# Patient Record
Sex: Female | Born: 1980 | Race: Black or African American | Hispanic: No | Marital: Single | Smoking: Never smoker
Health system: Southern US, Community
[De-identification: ages and names within clinical notes are randomized; demographics above are authoritative.]

## PROBLEM LIST (undated history)

## (undated) ENCOUNTER — Emergency Department (HOSPITAL_COMMUNITY): Disposition: A | Discharge status: Home / Self Care | Payer: Self-pay

## (undated) DIAGNOSIS — I10 Essential (primary) hypertension: Secondary | ICD-10-CM

## (undated) DIAGNOSIS — G43009 Migraine without aura, not intractable, without status migrainosus: Secondary | ICD-10-CM

## (undated) HISTORY — PX: MANDIBLE FRACTURE SURGERY: SHX706

---

## 1999-12-05 ENCOUNTER — Other Ambulatory Visit: Admission: RE | Admit: 1999-12-05 | Discharge: 1999-12-05 | Payer: Self-pay | Admitting: Obstetrics

## 2001-11-07 ENCOUNTER — Emergency Department (HOSPITAL_COMMUNITY): Admission: EM | Admit: 2001-11-07 | Discharge: 2001-11-07 | Payer: Self-pay | Admitting: Emergency Medicine

## 2002-01-11 ENCOUNTER — Emergency Department (HOSPITAL_COMMUNITY): Admission: EM | Admit: 2002-01-11 | Discharge: 2002-01-11 | Payer: Self-pay | Admitting: Emergency Medicine

## 2002-06-25 ENCOUNTER — Observation Stay (HOSPITAL_COMMUNITY): Admission: AD | Admit: 2002-06-25 | Discharge: 2002-06-26 | Payer: Self-pay | Admitting: Obstetrics

## 2002-06-26 ENCOUNTER — Encounter: Payer: Self-pay | Admitting: Obstetrics

## 2002-07-07 ENCOUNTER — Observation Stay (HOSPITAL_COMMUNITY): Admission: AD | Admit: 2002-07-07 | Discharge: 2002-07-08 | Payer: Self-pay | Admitting: Obstetrics

## 2002-08-04 ENCOUNTER — Inpatient Hospital Stay (HOSPITAL_COMMUNITY): Admission: AD | Admit: 2002-08-04 | Discharge: 2002-08-07 | Payer: Self-pay | Admitting: Obstetrics

## 2002-08-04 ENCOUNTER — Encounter (INDEPENDENT_AMBULATORY_CARE_PROVIDER_SITE_OTHER): Payer: Self-pay

## 2002-08-04 ENCOUNTER — Encounter: Payer: Self-pay | Admitting: Obstetrics

## 2003-01-21 ENCOUNTER — Emergency Department (HOSPITAL_COMMUNITY): Admission: EM | Admit: 2003-01-21 | Discharge: 2003-01-21 | Payer: Self-pay | Admitting: Emergency Medicine

## 2003-06-09 ENCOUNTER — Emergency Department (HOSPITAL_COMMUNITY): Admission: AD | Admit: 2003-06-09 | Discharge: 2003-06-09 | Payer: Self-pay | Admitting: Emergency Medicine

## 2003-06-09 ENCOUNTER — Encounter: Payer: Self-pay | Admitting: Emergency Medicine

## 2004-05-06 ENCOUNTER — Emergency Department (HOSPITAL_COMMUNITY): Admission: EM | Admit: 2004-05-06 | Discharge: 2004-05-06 | Payer: Self-pay | Admitting: Family Medicine

## 2005-01-17 ENCOUNTER — Emergency Department (HOSPITAL_COMMUNITY): Admission: EM | Admit: 2005-01-17 | Discharge: 2005-01-17 | Payer: Self-pay | Admitting: *Deleted

## 2007-04-21 ENCOUNTER — Emergency Department (HOSPITAL_COMMUNITY): Admission: EM | Admit: 2007-04-21 | Discharge: 2007-04-21 | Payer: Self-pay | Admitting: *Deleted

## 2008-02-27 ENCOUNTER — Emergency Department (HOSPITAL_COMMUNITY): Admission: EM | Admit: 2008-02-27 | Discharge: 2008-02-27 | Payer: Self-pay | Admitting: Emergency Medicine

## 2008-07-20 ENCOUNTER — Emergency Department (HOSPITAL_COMMUNITY): Admission: EM | Admit: 2008-07-20 | Discharge: 2008-07-20 | Payer: Self-pay | Admitting: Emergency Medicine

## 2011-03-30 NOTE — Discharge Summary (Signed)
   [name], Lisa                           ACCOUNT NO.:  [id number]   MEDICAL RECORD NO.:  [id number]                   PATIENT TYPE:  INP   LOCATION:  9108                                 FACILITY:  [facility]   PHYSICIAN:  Kathreen Cosier, M.D.           DATE OF BIRTH:  [birth year]   DATE OF ADMISSION:  08/04/2002  DATE OF DISCHARGE:                                 DISCHARGE SUMMARY   HISTORY OF PRESENT ILLNESS:  The patient is a 30 year old gravida 5, para 3-  0-1-3, [facility] August 29, 2002 pregnant with twins.  There was a question of  leaking fluid on September 23 for two days and the patient was in early  labor.  On examination cervix was 2 cm dilated with a breech presentation.  Ultrasound showed twin A breech 853 estimated fetal weight 2098-2399 g.  Twin B vertex, AFI 3.9, estimated fetal weight 2611 g.  The patient  underwent primary low transverse cesarean section and tubal ligation.  Twin  A with Apgar 8/9 weighing 5 pounds 2 ounces.  Twin B Apgar 3, 7, 9 weighing  4 pounds 4 ounces.  Placenta was sent to pathology.  Tubal ligation  performed.  Postoperatively she did well.  On admission her hemoglobin was  9.8, platelets 132,000.  Post delivery hemoglobin 8.4, platelets 128,000.  The patient did well and was discharged home on the third postoperative day,  ambulatory, on a regular diet, on Tylox one every three to four hours  p.r.n., to see me in six weeks.   DISCHARGE DIAGNOSES:  Status post primary low transverse cesarean section 36  weeks of a twin gestation presenting twin A breech, multiparity, tubal  ligation.                                               Kathreen Cosier, M.D.    BAM/MEDQ  D:  08/07/2002  T:  08/07/2002  Job:  [id number]

## 2011-03-30 NOTE — Op Note (Signed)
   [name], Lisa [name]                           ACCOUNT NO.:  [id number]   MEDICAL RECORD NO.:  [id number]                   PATIENT TYPE:  INP   LOCATION:  9108                                 FACILITY:  [facility]   PHYSICIAN:  Kathreen Cosier, M.D.           DATE OF BIRTH:  Aug 25, 1981   DATE OF PROCEDURE:  08/04/2002  DATE OF DISCHARGE:                                 OPERATIVE REPORT   PREOPERATIVE DIAGNOSES:  1. Intrauterine pregnancy, twins, 36 weeks.  2. Labor.  3. Breech presentation, Twin A.  4. Possible ruptured membranes.   ANESTHESIA:  Spinal.   SURGEON:  Kathreen Cosier, M.D.   PROCEDURE:  The patient placed on the operating table in supine position.  Abdomen prepped and draped.  Bladder emptied with a Foley catheter.  Transverse suprapubic incision made, carried down through the rectus fascia.  The fascia cleaned and incised the length of the incision.  The rectus  muscles retracted laterally, peritoneum incised longitudinally.  A  transverse incision made in the visceral peritoneum above the bladder and  the bladder mobilized inferiorly.  Transverse lower uterine incision made.  Twin _______ was normal vertex, female, Apgar 8 and 9, weighing 5 pounds 2  ounces.  The membranes were intact.  Twin ________, breech presentation,  female, Apgars 3, 7, 9, 4 pounds 4 ounces.  The fluid was clear for both and  there were two placentas, two sacs.  The placenta was sent to pathology.  The uterine cavity cleaned and dry with laps.  The uterine incision closed  in one layer with continuous suture of 1 chromic.  Bladder flap reattached  with 2-0 chromic.  The right tube was grasped in the midportion with a  Babcock clamp and 0 plain suture placed in the mesosalpinx below the portion  of the tube in the clamp.  This was tied and approximately one inch of tube  transected.  On the _______ side, the tube was intimately adherent to some  large distended vessels and two portions of  tube removed from the ______  side and sent to pathology.  The abdomen closed in layers.  Peritoneum  continuous with 0 chromic, fascia continuous with 0 Dexon.  Next, the skin  closed with subcuticular stitch of 3-0 Monocryl.                                               Kathreen Cosier, M.D.    BAM/MEDQ  D:  08/04/2002  T:  08/04/2002  Job:  [id number]

## 2011-03-30 NOTE — H&P (Signed)
   NAMECODI, FOLKERTS                           ACCOUNT NO.:  [id number]   MEDICAL RECORD NO.:  [id number]                   PATIENT TYPE:  INP   LOCATION:  9108                                 FACILITY:  [facility]   PHYSICIAN:  Kathreen Cosier, M.D.           DATE OF BIRTH:  1981/02/13   DATE OF ADMISSION:  08/04/2002  DATE OF DISCHARGE:                                HISTORY & PHYSICAL   HISTORY OF PRESENT ILLNESS:  The patient is a 30 year old gravida 5, para 3-  0-1-3, [facility] August 29, 2002, pregnant with twins.  The patient said she was  leaking fluid for approximately two days.  Perineum was noted to be wet, but  no fluid noted on speculum examination.  She was sent to the hospital for an  ultrasound.  Twin A was a breech 853.  Estimated fetal weight was __________  g.  Twin B vertex 853.9.  Estimated fetal weight 2611 g.  Cervix is 2 cm.  There is a breech presenting at -3 station.  The patient was in labor and it  is decided she will be delivered by a cesarean section at 36 weeks because  of possible leaking fluid and twin A breech presentation.   PHYSICAL EXAMINATION:  GENERAL:  Well-developed female in early labor.  HEENT:  Negative.  LUNGS:  Clear.  HEART:  Regular rhythm.  No murmurs.  No gallops.  ABDOMEN:  Term sized uterus.  PELVIC:  As described above.  EXTREMITIES:  Negative.                                               Kathreen Cosier, M.D.    BAM/MEDQ  D:  08/04/2002  T:  08/05/2002  Job:  [id number]

## 2011-08-07 LAB — POCT PREGNANCY, URINE
Operator id: 239701
Preg Test, Ur: NEGATIVE

## 2011-08-07 LAB — POCT URINALYSIS DIP (DEVICE)
Operator id: 239701
Protein, ur: NEGATIVE
Urobilinogen, UA: 0.2
pH: 5.5

## 2011-08-07 LAB — URINE CULTURE: Colony Count: 50000

## 2011-08-15 LAB — COMPREHENSIVE METABOLIC PANEL
Albumin: 3.8
Alkaline Phosphatase: 58
BUN: 8
Calcium: 8.8
Creatinine, Ser: 0.6
Glucose, Bld: 84
Potassium: 4
Total Protein: 7.3

## 2011-08-15 LAB — DIFFERENTIAL
Basophils Relative: 1
Lymphocytes Relative: 26
Lymphs Abs: 1.3
Monocytes Absolute: 0.5
Monocytes Relative: 11
Neutro Abs: 3
Neutrophils Relative %: 61

## 2011-08-15 LAB — URINALYSIS, ROUTINE W REFLEX MICROSCOPIC
Glucose, UA: NEGATIVE
Glucose, UA: NEGATIVE
Hgb urine dipstick: NEGATIVE
Hgb urine dipstick: NEGATIVE
Protein, ur: NEGATIVE
Protein, ur: NEGATIVE
Specific Gravity, Urine: 1.009
Specific Gravity, Urine: 1.02
Urobilinogen, UA: 0.2

## 2011-08-15 LAB — CBC
HCT: 35.7 — ABNORMAL LOW
Hemoglobin: 11.8 — ABNORMAL LOW
MCHC: 33
Platelets: 228
RDW: 13.9

## 2011-08-15 LAB — URINE MICROSCOPIC-ADD ON

## 2011-08-15 LAB — POCT PREGNANCY, URINE: Preg Test, Ur: NEGATIVE

## 2011-08-15 LAB — WET PREP, GENITAL

## 2011-08-15 LAB — GC/CHLAMYDIA PROBE AMP, GENITAL: GC Probe Amp, Genital: NEGATIVE

## 2012-06-02 ENCOUNTER — Emergency Department (HOSPITAL_COMMUNITY)
Admission: EM | Admit: 2012-06-02 | Discharge: 2012-06-02 | Discharge status: Against Medical Advice | Payer: Self-pay | Attending: Emergency Medicine | Admitting: Emergency Medicine

## 2012-06-02 ENCOUNTER — Encounter (HOSPITAL_COMMUNITY): Payer: Self-pay | Admitting: *Deleted

## 2012-06-02 DIAGNOSIS — R300 Dysuria: Secondary | ICD-10-CM | POA: Insufficient documentation

## 2012-06-02 DIAGNOSIS — R109 Unspecified abdominal pain: Secondary | ICD-10-CM | POA: Insufficient documentation

## 2012-06-02 LAB — URINALYSIS, ROUTINE W REFLEX MICROSCOPIC
Hgb urine dipstick: NEGATIVE
Nitrite: NEGATIVE
Specific Gravity, Urine: 1.018 (ref 1.005–1.030)
Urobilinogen, UA: 1 mg/dL (ref 0.0–1.0)
pH: 7.5 (ref 5.0–8.0)

## 2012-06-02 LAB — POCT PREGNANCY, URINE: Preg Test, Ur: NEGATIVE

## 2012-06-02 NOTE — ED Notes (Signed)
Pt reports continuous right flank pain, reports that she has had discomfort with urination.

## 2012-06-02 NOTE — ED Notes (Signed)
x2 check, pt not in the room, will recheck

## 2012-06-02 NOTE — ED Notes (Signed)
Went into the pts room, pt not in the room, will recheck to see if the pt left AMA

## 2012-06-02 NOTE — ED Notes (Signed)
Updated on wait time, verbalized understanding.

## 2012-06-02 NOTE — ED Notes (Signed)
x3 recheck, pt not in room, pt LWBS after triage

## 2012-12-23 ENCOUNTER — Emergency Department (HOSPITAL_COMMUNITY)
Admission: EM | Admit: 2012-12-23 | Discharge: 2012-12-23 | Disposition: A | Discharge status: Home / Self Care | Payer: Self-pay | Attending: Emergency Medicine | Admitting: Emergency Medicine

## 2012-12-23 ENCOUNTER — Emergency Department (HOSPITAL_COMMUNITY): Payer: Self-pay

## 2012-12-23 ENCOUNTER — Encounter (HOSPITAL_COMMUNITY): Payer: Self-pay | Admitting: Emergency Medicine

## 2012-12-23 DIAGNOSIS — F1010 Alcohol abuse, uncomplicated: Secondary | ICD-10-CM | POA: Insufficient documentation

## 2012-12-23 DIAGNOSIS — S0300XA Dislocation of jaw, unspecified side, initial encounter: Secondary | ICD-10-CM | POA: Insufficient documentation

## 2012-12-23 DIAGNOSIS — Y939 Activity, unspecified: Secondary | ICD-10-CM | POA: Insufficient documentation

## 2012-12-23 DIAGNOSIS — Y929 Unspecified place or not applicable: Secondary | ICD-10-CM | POA: Insufficient documentation

## 2012-12-23 DIAGNOSIS — X58XXXA Exposure to other specified factors, initial encounter: Secondary | ICD-10-CM | POA: Insufficient documentation

## 2012-12-23 LAB — POCT I-STAT, CHEM 8
Hemoglobin: 12.9 g/dL (ref 12.0–15.0)
Potassium: 3.5 mEq/L (ref 3.5–5.1)
Sodium: 145 mEq/L (ref 135–145)
TCO2: 23 mmol/L (ref 0–100)

## 2012-12-23 MED ORDER — PROPOFOL 10 MG/ML IV BOLUS
INTRAVENOUS | Status: AC
  Filled 2012-12-23: qty 20

## 2012-12-23 MED ORDER — KETAMINE HCL 10 MG/ML IJ SOLN
INTRAMUSCULAR | Status: DC | PRN
  Administered 2012-12-23: 35 mg via INTRAVENOUS

## 2012-12-23 MED ORDER — PROPOFOL 10 MG/ML IV BOLUS
100.0000 mg | Freq: Once | INTRAVENOUS | Status: AC
  Administered 2012-12-23: 35 mg via INTRAVENOUS

## 2012-12-23 MED ORDER — KETAMINE HCL 10 MG/ML IJ SOLN
100.0000 mg | Freq: Once | INTRAMUSCULAR | Status: AC
  Administered 2012-12-23: 35 mg via INTRAVENOUS
  Filled 2012-12-23: qty 10

## 2012-12-23 MED ORDER — PROPOFOL 10 MG/ML IV BOLUS
INTRAVENOUS | Status: DC | PRN
  Administered 2012-12-23: 35 mg via INTRAVENOUS

## 2012-12-23 NOTE — ED Notes (Signed)
Pt is alert and oriented no distress noted.  Resp symmetrical and unlabored.  Skin warm and dry.

## 2012-12-23 NOTE — ED Provider Notes (Signed)
History     CSN: [id number]  Arrival date & time 12/23/12  2130   First MD Initiated Contact with Patient 12/23/12 [phone]      Chief Complaint  Patient presents with  . Jaw Pain  . Alcohol Intoxication     HPI Patient presents emergency room with chief complaint of jaw pain and inability to close her mouth.  This started last night.  No history of trauma.  No history of similar symptoms in the past.  Patient has had some beer last night.  No other complaints. History reviewed. No pertinent past medical history.  Past Surgical History  Procedure Laterality Date  . Mandible fracture surgery      No family history on file.  History  Substance Use Topics  . Smoking status: Never Smoker   . Smokeless tobacco: Not on file  . Alcohol Use: Yes     Comment: occ    OB History   Grav Para Term Preterm Abortions TAB SAB Ect Mult Living                  Review of Systems All other systems reviewed and are negative Allergies  Review of patient's allergies indicates no known allergies.  Home Medications  No current outpatient prescriptions on file.  BP 126/93  Pulse 84  Resp 10  SpO2 100%  LMP 11/25/2012  Physical Exam  Nursing note and vitals reviewed. Constitutional: She is oriented to person, place, and time. She appears well-developed and well-nourished. No distress.  HENT:  Head: Normocephalic and atraumatic.  Mouth/Throat: Uvula is midline.  Patient with discomfort from the inability to close her mouth.  No airway issues at this time.  Eyes: Pupils are equal, round, and reactive to light.  Neck: Normal range of motion.  Cardiovascular: Normal rate and intact distal pulses.   Pulmonary/Chest: No respiratory distress.  Abdominal: Normal appearance. She exhibits no distension.  Musculoskeletal: Normal range of motion.  Neurological: She is alert and oriented to person, place, and time. No cranial nerve deficit.  Skin: Skin is warm and dry. No rash noted.   Psychiatric: She has a normal mood and affect. Her behavior is normal.    ED Course  Reduction of dislocation Date/Time: 12/23/2012 10:35 AM Performed by: Nelia Shi Authorized by: Nelia Shi Consent: Verbal consent obtained. written consent obtained. Risks and benefits: risks, benefits and alternatives were discussed Consent given by: patient Patient understanding: patient states understanding of the procedure being performed Patient identity confirmed: verbally with patient, arm band and provided demographic data Time out: Immediately prior to procedure a "time out" was called to verify the correct patient, procedure, equipment, support staff and site/side marked as required. Patient sedated: yes Sedation type: moderate (conscious) sedation Sedatives: ketamine and propofol Vitals: Vital signs were monitored during sedation. Patient tolerance: Patient tolerated the procedure well with no immediate complications.   (including critical care time)  Labs Reviewed  POCT I-STAT, CHEM 8 - Abnormal; Notable for the following:    BUN <3 (*)    All other components within normal limits   Dg Orthopantogram  12/23/2012  *RADIOLOGY REPORT*  Clinical Data: Postreduction films.  ORTHOPANTOGRAM/PANORAMIC  Comparison: 12/23/2012  Findings: Interval reduction of the bilateral TMJ dislocation.  No evidence for dislocation currently.  No fracture.  IMPRESSION: Interval reduction of the bilateral TMJ dislocation.   Original Report Authenticated By: Charlett Nose, M.D.    Dg Orthopantogram  12/23/2012  *RADIOLOGY REPORT*  Clinical Data: Unable to  close mouth.  ORTHOPANTOGRAM/PANORAMIC  Comparison:  None.  Findings: There is dislocation of the temporomandibular joints bilaterally.  No fracture visualized.  The patient is missing multiple mandibular and maxillary teeth.  IMPRESSION: Bilateral TMJ dislocation.   Original Report Authenticated By: Charlett Nose, M.D.      No diagnosis  found.    MDM         Nelia Shi, MD 12/23/12 [phone]

## 2012-12-23 NOTE — ED Notes (Addendum)
PT. REPORTS BILATERAL JAW PAIN ONSET LAST NIGHT DENIES INJURY , RESPIRATIONS UNLABORED. AIRWAY INTACT . PT. STATES UNABLE TO CLOSE MOUTH. PT. ALSO ADDED HISTORY OF JAW SURGERY .  + ETOH .

## 2012-12-23 NOTE — ED Notes (Signed)
Jaw reduced.

## 2012-12-23 NOTE — ED Notes (Signed)
Returned from Radiology.  No distress noted.

## 2014-06-06 ENCOUNTER — Emergency Department (HOSPITAL_COMMUNITY)
Admission: EM | Admit: 2014-06-06 | Discharge: 2014-06-06 | Disposition: A | Discharge status: Home / Self Care | Payer: Self-pay | Attending: Emergency Medicine | Admitting: Emergency Medicine

## 2014-06-06 ENCOUNTER — Emergency Department (HOSPITAL_COMMUNITY): Payer: Self-pay

## 2014-06-06 ENCOUNTER — Encounter (HOSPITAL_COMMUNITY): Payer: Self-pay | Admitting: Emergency Medicine

## 2014-06-06 DIAGNOSIS — R599 Enlarged lymph nodes, unspecified: Secondary | ICD-10-CM | POA: Insufficient documentation

## 2014-06-06 DIAGNOSIS — N898 Other specified noninflammatory disorders of vagina: Secondary | ICD-10-CM | POA: Insufficient documentation

## 2014-06-06 DIAGNOSIS — Z3202 Encounter for pregnancy test, result negative: Secondary | ICD-10-CM | POA: Insufficient documentation

## 2014-06-06 DIAGNOSIS — Z87440 Personal history of urinary (tract) infections: Secondary | ICD-10-CM | POA: Insufficient documentation

## 2014-06-06 DIAGNOSIS — R51 Headache: Secondary | ICD-10-CM | POA: Insufficient documentation

## 2014-06-06 DIAGNOSIS — A5901 Trichomonal vulvovaginitis: Secondary | ICD-10-CM | POA: Insufficient documentation

## 2014-06-06 DIAGNOSIS — L989 Disorder of the skin and subcutaneous tissue, unspecified: Secondary | ICD-10-CM | POA: Insufficient documentation

## 2014-06-06 DIAGNOSIS — R109 Unspecified abdominal pain: Secondary | ICD-10-CM | POA: Insufficient documentation

## 2014-06-06 DIAGNOSIS — A599 Trichomoniasis, unspecified: Secondary | ICD-10-CM

## 2014-06-06 LAB — POC URINE PREG, ED: Preg Test, Ur: NEGATIVE

## 2014-06-06 LAB — WET PREP, GENITAL: Yeast Wet Prep HPF POC: NONE SEEN

## 2014-06-06 LAB — URINALYSIS, ROUTINE W REFLEX MICROSCOPIC
BILIRUBIN URINE: NEGATIVE
Glucose, UA: NEGATIVE mg/dL
KETONES UR: NEGATIVE mg/dL
Nitrite: NEGATIVE
PROTEIN: NEGATIVE mg/dL
Specific Gravity, Urine: 1.006 (ref 1.005–1.030)
UROBILINOGEN UA: 0.2 mg/dL (ref 0.0–1.0)
pH: 6.5 (ref 5.0–8.0)

## 2014-06-06 LAB — URINE MICROSCOPIC-ADD ON

## 2014-06-06 MED ORDER — TRAMADOL HCL 50 MG PO TABS: 50.0000 mg | ORAL_TABLET | Freq: Four times a day (QID) | ORAL | Status: DC | PRN

## 2014-06-06 MED ORDER — METRONIDAZOLE 500 MG PO TABS
2000.0000 mg | ORAL_TABLET | Freq: Once | ORAL | Status: AC
  Administered 2014-06-06: 2000 mg via ORAL
  Filled 2014-06-06: qty 4

## 2014-06-06 MED ORDER — LIDOCAINE HCL (PF) 1 % IJ SOLN
5.0000 mL | Freq: Once | INTRAMUSCULAR | Status: AC
  Administered 2014-06-06: 5 mL
  Filled 2014-06-06: qty 5

## 2014-06-06 MED ORDER — METRONIDAZOLE 500 MG PO TABS: 500.0000 mg | ORAL_TABLET | Freq: Two times a day (BID) | ORAL | Status: DC

## 2014-06-06 MED ORDER — IBUPROFEN 800 MG PO TABS: 800.0000 mg | ORAL_TABLET | Freq: Three times a day (TID) | ORAL | Status: DC

## 2014-06-06 MED ORDER — AZITHROMYCIN 250 MG PO TABS
1000.0000 mg | ORAL_TABLET | Freq: Once | ORAL | Status: AC
  Administered 2014-06-06: 1000 mg via ORAL
  Filled 2014-06-06: qty 4

## 2014-06-06 MED ORDER — CEFTRIAXONE SODIUM 250 MG IJ SOLR
250.0000 mg | Freq: Once | INTRAMUSCULAR | Status: AC
  Administered 2014-06-06: 250 mg via INTRAMUSCULAR
  Filled 2014-06-06: qty 250

## 2014-06-06 MED ORDER — DOXYCYCLINE HYCLATE 100 MG PO CAPS: 100.0000 mg | ORAL_CAPSULE | Freq: Two times a day (BID) | ORAL | Status: DC

## 2014-06-06 NOTE — ED Provider Notes (Signed)
CSN: [id number]     Arrival date & time 06/06/14  6270 History  This chart was scribed for non-physician practitioner Cleatrice Burke, PA-C, working with Orlie Dakin, MD by Ludger Nutting, ED Scribe. This patient was seen in room TR09C/TR09C and the patient's care was started at 10:43 AM.    Chief Complaint  Patient presents with  . Skin Problem    The history is provided by the patient. No language interpreter was used.    HPI Comments: Lisa [name] is a 33 y.o. female who presents to the Emergency Department complaining of 1 week of gradual onset, gradually worsening, constant pain and swelling to the right posterior scalp. She describes the pain as pressure and states it is worse with laying on that side. She denies head injury. She denies drainage from the affected area.   Patient states she has a throbbing, pressure-like sensation to the left suprapubic region each time she urinates. She states this has been ongoing for the past couple of weeks but she has not sought treatment. She reports having a UTI in the past when she was pregnant. She denies dysuria. She has a watery vaginal discharge. She has not been sexually active in months, but states she broke up with her prior boyfriend because she thought he was cheating on her.     History reviewed. No pertinent past medical history. Past Surgical History  Procedure Laterality Date  . Mandible fracture surgery     History reviewed. No pertinent family history. History  Substance Use Topics  . Smoking status: Never Smoker   . Smokeless tobacco: Not on file  . Alcohol Use: Yes     Comment: occ   OB History   Grav Para Term Preterm Abortions TAB SAB Ect Mult Living                 Review of Systems  Constitutional: Negative for fever and chills.  HENT: Negative for dental problem, ear pain, rhinorrhea, sinus pressure and sore throat.   Respiratory: Negative for cough and shortness of breath.   Cardiovascular: Negative for  chest pain.  Gastrointestinal: Positive for abdominal pain (suprapubic).  Genitourinary: Negative for dysuria.  Skin:       Pain and swelling to right posterior scalp  All other systems reviewed and are negative.     Allergies  Review of patient's allergies indicates no known allergies.  Home Medications   Prior to Admission medications   Not on File   BP 135/93  Pulse 70  Temp(Src) 98.4 F (36.9 C) (Oral)  Resp 16  Ht 5\' 5"  (1.651 m)  Wt 142 lb (64.411 kg)  BMI 23.63 kg/m2  SpO2 100% Physical Exam  Nursing note and vitals reviewed. Constitutional: She is oriented to person, place, and time. She appears well-developed and well-nourished. No distress.  HENT:  Head: Normocephalic and atraumatic.  Right Ear: External ear normal.  Left Ear: External ear normal.  Nose: Nose normal.  Mouth/Throat: Oropharynx is clear and moist.  Eyes: Conjunctivae are normal.  Neck: Normal range of motion.  Cardiovascular: Normal rate, regular rhythm and normal heart sounds.   Pulmonary/Chest: Effort normal and breath sounds normal. No stridor. No respiratory distress. She has no wheezes. She has no rales.  Abdominal: Soft. She exhibits no distension.  Genitourinary: There is no rash, tenderness, lesion or injury on the right labia. There is no rash, tenderness, lesion or injury on the left labia. Uterus is tender. Cervix exhibits discharge. Cervix exhibits  no motion tenderness and no friability. Right adnexum displays no mass, no tenderness and no fullness. Left adnexum displays tenderness. Left adnexum displays no mass and no fullness. No erythema, tenderness or bleeding around the vagina. No foreign body around the vagina. No signs of injury around the vagina. Vaginal discharge found.  Watery vaginal discharge  Musculoskeletal: Normal range of motion.  Lymphadenopathy:       Head (right side): Occipital adenopathy present.       Head (left side): No occipital adenopathy present.    She has  no cervical adenopathy.       Right: Inguinal adenopathy present.       Left: Inguinal adenopathy present.  Right occipital lymph node enlargement.   Neurological: She is alert and oriented to person, place, and time. She has normal strength.  Skin: Skin is warm and dry. She is not diaphoretic. No erythema.  Psychiatric: She has a normal mood and affect. Her behavior is normal.    ED Course  Procedures (including critical care time)  DIAGNOSTIC STUDIES: Oxygen Saturation is 100% on RA, normal by my interpretation.    COORDINATION OF CARE: 10:48 AM Discussed treatment plan with pt at bedside and pt agreed to plan.   Labs Review Labs Reviewed  WET PREP, GENITAL - Abnormal; Notable for the following:    Trich, Wet Prep MODERATE (*)    Clue Cells Wet Prep HPF POC FEW (*)    WBC, Wet Prep HPF POC FEW (*)    All other components within normal limits  URINALYSIS, ROUTINE W REFLEX MICROSCOPIC - Abnormal; Notable for the following:    APPearance HAZY (*)    Hgb urine dipstick SMALL (*)    Leukocytes, UA MODERATE (*)    All other components within normal limits  URINE MICROSCOPIC-ADD ON - Abnormal; Notable for the following:    Squamous Epithelial / LPF FEW (*)    Bacteria, UA FEW (*)    All other components within normal limits  URINE CULTURE  GC/CHLAMYDIA PROBE AMP  POC URINE PREG, ED    Imaging Review US Transvaginal Non-ob  06/06/2014   CLINICAL DATA:  rule out toa + torsion rule out toa + torsion; rule out toa + torsion; rule out toa + torsion pain  EXAM: TRANSABDOMINAL AND TRANSVAGINAL ULTRASOUND OF PELVIS  DOPPLER ULTRASOUND OF OVARIES  TECHNIQUE: Both transabdominal and transvaginal ultrasound examinations of the pelvis were performed. Transabdominal technique was performed for global imaging of the pelvis including uterus, ovaries, adnexal regions, and pelvic cul-de-sac.  It was necessary to proceed with endovaginal exam following the transabdominal exam to visualize the  ovaries to advantage. Color and duplex Doppler ultrasound was utilized to evaluate blood flow to the ovaries.  COMPARISON:  06/01/2003 by report only  FINDINGS: Uterus  Measurements: 97 x 63 x 65 mm. There is a dominant posterior body fibroid 5.3 x 3.3 x 4 cm.  Endometrium  Thickness: 13.4 mm.  No focal abnormality visualized.  Right ovary  Measurements: 45 x 20 x 28 mm. Single small follicle.  Left ovary  Measurements: 59 x 33 x 36 mm. There is a dominant 43 x 29 x 30 mm simple appearing cyst without thickened internal septations or mural nodularity.  Pulsed Doppler evaluation of both ovaries demonstrates normal low-resistance arterial and venous waveforms. Normal color Doppler signal.  Other findings  Trace free fluid  IMPRESSION: 1. No ultrasound evidence of ovarian torsion. 2. No evidence tubo-ovarian abscess or other acute abnormality. 3. 4.3  cm left ovarian cyst, probably benign.   Electronically Signed   By: Arne Cleveland M.D.   On: 06/06/2014 14:54   US Pelvis Complete  06/06/2014   CLINICAL DATA:  rule out toa + torsion rule out toa + torsion; rule out toa + torsion; rule out toa + torsion pain  EXAM: TRANSABDOMINAL AND TRANSVAGINAL ULTRASOUND OF PELVIS  DOPPLER ULTRASOUND OF OVARIES  TECHNIQUE: Both transabdominal and transvaginal ultrasound examinations of the pelvis were performed. Transabdominal technique was performed for global imaging of the pelvis including uterus, ovaries, adnexal regions, and pelvic cul-de-sac.  It was necessary to proceed with endovaginal exam following the transabdominal exam to visualize the ovaries to advantage. Color and duplex Doppler ultrasound was utilized to evaluate blood flow to the ovaries.  COMPARISON:  06/01/2003 by report only  FINDINGS: Uterus  Measurements: 97 x 63 x 65 mm. There is a dominant posterior body fibroid 5.3 x 3.3 x 4 cm.  Endometrium  Thickness: 13.4 mm.  No focal abnormality visualized.  Right ovary  Measurements: 45 x 20 x 28 mm. Single small  follicle.  Left ovary  Measurements: 59 x 33 x 36 mm. There is a dominant 43 x 29 x 30 mm simple appearing cyst without thickened internal septations or mural nodularity.  Pulsed Doppler evaluation of both ovaries demonstrates normal low-resistance arterial and venous waveforms. Normal color Doppler signal.  Other findings  Trace free fluid  IMPRESSION: 1. No ultrasound evidence of ovarian torsion. 2. No evidence tubo-ovarian abscess or other acute abnormality. 3. 4.3 cm left ovarian cyst, probably benign.   Electronically Signed   By: Arne Cleveland M.D.   On: 06/06/2014 14:54   [place] Art/ven Flow Abd Pelv Doppler  06/06/2014   CLINICAL DATA:  rule out toa + torsion rule out toa + torsion; rule out toa + torsion; rule out toa + torsion pain  EXAM: TRANSABDOMINAL AND TRANSVAGINAL ULTRASOUND OF PELVIS  DOPPLER ULTRASOUND OF OVARIES  TECHNIQUE: Both transabdominal and transvaginal ultrasound examinations of the pelvis were performed. Transabdominal technique was performed for global imaging of the pelvis including uterus, ovaries, adnexal regions, and pelvic cul-de-sac.  It was necessary to proceed with endovaginal exam following the transabdominal exam to visualize the ovaries to advantage. Color and duplex Doppler ultrasound was utilized to evaluate blood flow to the ovaries.  COMPARISON:  06/01/2003 by report only  FINDINGS: Uterus  Measurements: 97 x 63 x 65 mm. There is a dominant posterior body fibroid 5.3 x 3.3 x 4 cm.  Endometrium  Thickness: 13.4 mm.  No focal abnormality visualized.  Right ovary  Measurements: 45 x 20 x 28 mm. Single small follicle.  Left ovary  Measurements: 59 x 33 x 36 mm. There is a dominant 43 x 29 x 30 mm simple appearing cyst without thickened internal septations or mural nodularity.  Pulsed Doppler evaluation of both ovaries demonstrates normal low-resistance arterial and venous waveforms. Normal color Doppler signal.  Other findings  Trace free fluid  IMPRESSION: 1. No ultrasound  evidence of ovarian torsion. 2. No evidence tubo-ovarian abscess or other acute abnormality. 3. 4.3 cm left ovarian cyst, probably benign.   Electronically Signed   By: Arne Cleveland M.D.   On: 06/06/2014 14:54     EKG Interpretation None      MDM   Final diagnoses:  Trichomoniasis  Enlarged lymph node   Patient presents to ED with complaint of enlarged lymph node to right occiput. Patient's only complaint is suprapubic pain when  she urinates and watery vaginal discharge. Patient was found to have trichomoniasis. Left sided adnexal tenderness. [place] was done which shows no ovarian torsion or TOA. There is a 4.3 cm cyst which was discussed with the patient. Patient will be treated for PID with doxycycline and flagyl. Patient was given flagyl, azithromycin, and rocephin in the ED. She will follow up with GYN. Discussed reasons to return to ED. Vital signs stable for discharge. Discussed no drinking on flagyl. Patient / Family / Caregiver informed of clinical course, understand medical decision-making process, and agree with plan.   I personally performed the services described in this documentation, which was scribed in my presence. The recorded information has been reviewed and is accurate.    Elwyn Lade, PA-C 06/07/14 1015

## 2014-06-06 NOTE — ED Notes (Signed)
She states "ive had a knot on my head for a week. It seems like its getting bigger. It hurts when i lay on it." denies any head injury.

## 2014-06-06 NOTE — ED Notes (Signed)
Declined W/C at D/C and was escorted to lobby by RN. 

## 2014-06-06 NOTE — Discharge Instructions (Signed)
Trichomoniasis Trichomoniasis is an infection caused by an organism called Trichomonas. The infection can affect both women and men. In women, the outer female genitalia and the vagina are affected. In men, the penis is mainly affected, but the prostate and other reproductive organs can also be involved. Trichomoniasis is a sexually transmitted infection (STI) and is most often passed to another person through sexual contact.  RISK FACTORS  Having unprotected sexual intercourse.  Having sexual intercourse with an infected partner. SIGNS AND SYMPTOMS  Symptoms of trichomoniasis in women include:  Abnormal gray-green frothy vaginal discharge.  Itching and irritation of the vagina.  Itching and irritation of the area outside the vagina. Symptoms of trichomoniasis in men include:   Penile discharge with or without pain.  Pain during urination. This results from inflammation of the urethra. DIAGNOSIS  Trichomoniasis may be found during a Pap test or physical exam. Your health care provider may use one of the following methods to help diagnose this infection:  Examining vaginal discharge under a microscope. For men, urethral discharge would be examined.  Testing the pH of the vagina with a test tape.  Using a vaginal swab test that checks for the Trichomonas organism. A test is available that provides results within a few minutes.  Doing a culture test for the organism. This is not usually needed. TREATMENT   You may be given medicine to fight the infection. Women should inform their health care provider if they could be or are pregnant. Some medicines used to treat the infection should not be taken during pregnancy.  Your health care provider may recommend over-the-counter medicines or creams to decrease itching or irritation.  Your sexual partner will need to be treated if infected. HOME CARE INSTRUCTIONS   Take medicines only as directed by your health care provider.  Take  over-the-counter medicine for itching or irritation as directed by your health care provider.  Do not have sexual intercourse while you have the infection.  Women should not douche or wear tampons while they have the infection.  Discuss your infection with your partner. Your partner may have gotten the infection from you, or you may have gotten it from your partner.  Have your sex partner get examined and treated if necessary.  Practice safe, informed, and protected sex.  See your health care provider for other STI testing. SEEK MEDICAL CARE IF:   You still have symptoms after you finish your medicine.  You develop abdominal pain.  You have pain when you urinate.  You have bleeding after sexual intercourse.  You develop a rash.  Your medicine makes you sick or makes you throw up (vomit). MAKE SURE YOU:  Understand these instructions.  Will watch your condition.  Will get help right away if you are not doing well or get worse. Document Released: 04/24/2001 Document Revised: 03/15/2014 Document Reviewed: 08/10/2013 [facility] Patient Information 2015 [place], [place]. This information is not intended to replace advice given to you by your health care provider. Make sure you discuss any questions you have with your health care provider.  Swollen Lymph Nodes The lymphatic system filters fluid from around cells. It is like a system of blood vessels. These channels carry lymph instead of blood. The lymphatic system is an important part of the immune (disease fighting) system. When people talk about "swollen glands in the neck," they are usually talking about swollen lymph nodes. The lymph nodes are like the little traps for infection. You and your caregiver may be able  to feel lymph nodes, especially swollen nodes, in these common areas: the groin (inguinal area), armpits (axilla), and above the clavicle (supraclavicular). You may also feel them in the neck (cervical) and the back of the  head just above the hairline (occipital). Swollen glands occur when there is any condition in which the body responds with an allergic type of reaction. For instance, the glands in the neck can become swollen from insect bites or any type of minor infection on the head. These are very noticeable in children with only minor problems. Lymph nodes may also become swollen when there is a tumor or problem with the lymphatic system, such as Hodgkin's disease. TREATMENT   Most swollen glands do not require treatment. They can be observed (watched) for a short period of time, if your caregiver feels it is necessary. Most of the time, observation is not necessary.  Antibiotics (medicines that kill germs) may be prescribed by your caregiver. Your caregiver may prescribe these if he or she feels the swollen glands are due to a bacterial (germ) infection. Antibiotics are not used if the swollen glands are caused by a virus. HOME CARE INSTRUCTIONS   Take medications as directed by your caregiver. Only take over-the-counter or prescription medicines for pain, discomfort, or fever as directed by your caregiver. SEEK MEDICAL CARE IF:   If you begin to run a temperature greater than 102 F (38.9 C), or as your caregiver suggests. MAKE SURE YOU:   Understand these instructions.  Will watch your condition.  Will get help right away if you are not doing well or get worse. Document Released: 10/19/2002 Document Revised: 01/21/2012 Document Reviewed: 10/29/2005 [facility] Patient Information 2015 [place], [place]. This information is not intended to replace advice given to you by your health care provider. Make sure you discuss any questions you have with your health care provider.

## 2014-06-07 LAB — GC/CHLAMYDIA PROBE AMP
CT Probe RNA: NEGATIVE
GC Probe RNA: NEGATIVE

## 2014-06-08 LAB — URINE CULTURE

## 2014-06-10 NOTE — ED Provider Notes (Signed)
Medical screening examination/treatment/procedure(s) were performed by non-physician practitioner and as supervising physician I was immediately available for consultation/collaboration.   EKG Interpretation None       Orlie Dakin, MD 06/10/14 [phone]

## 2014-06-11 ENCOUNTER — Telehealth (HOSPITAL_BASED_OUTPATIENT_CLINIC_OR_DEPARTMENT_OTHER): Payer: Self-pay | Admitting: Emergency Medicine

## 2014-06-11 NOTE — Telephone Encounter (Signed)
Post ED Visit - Positive Culture Follow-up  Culture report reviewed by antimicrobial stewardship pharmacist: []  Wes Paterson, Pharm.D., BCPS [x]  Heide Guile, Pharm.D., BCPS []  Alycia Rossetti, Pharm.D., BCPS []  [place], Pharm.D., BCPS, AAHIVP []  Legrand Como, Pharm.D., BCPS, AAHIVP  Positive urine culture Per Josh Geiple PA-C, no treatment needed and no further patient follow-up is required at this time.  [place], Rex Kras 06/11/2014, 11:06 AM

## 2015-08-11 ENCOUNTER — Emergency Department (HOSPITAL_COMMUNITY): Payer: Self-pay

## 2015-08-11 ENCOUNTER — Emergency Department (HOSPITAL_COMMUNITY)
Admission: EM | Admit: 2015-08-11 | Discharge: 2015-08-11 | Disposition: A | Discharge status: Home / Self Care | Payer: Self-pay | Attending: Emergency Medicine | Admitting: Emergency Medicine

## 2015-08-11 ENCOUNTER — Encounter (HOSPITAL_COMMUNITY): Payer: Self-pay

## 2015-08-11 DIAGNOSIS — N830 Follicular cyst of ovary: Secondary | ICD-10-CM | POA: Insufficient documentation

## 2015-08-11 DIAGNOSIS — Y9389 Activity, other specified: Secondary | ICD-10-CM | POA: Insufficient documentation

## 2015-08-11 DIAGNOSIS — X58XXXA Exposure to other specified factors, initial encounter: Secondary | ICD-10-CM | POA: Insufficient documentation

## 2015-08-11 DIAGNOSIS — D259 Leiomyoma of uterus, unspecified: Secondary | ICD-10-CM

## 2015-08-11 DIAGNOSIS — W448XXA Other foreign body entering into or through a natural orifice, initial encounter: Secondary | ICD-10-CM

## 2015-08-11 DIAGNOSIS — T192XXA Foreign body in vulva and vagina, initial encounter: Secondary | ICD-10-CM | POA: Insufficient documentation

## 2015-08-11 DIAGNOSIS — Y9289 Other specified places as the place of occurrence of the external cause: Secondary | ICD-10-CM | POA: Insufficient documentation

## 2015-08-11 DIAGNOSIS — Z3202 Encounter for pregnancy test, result negative: Secondary | ICD-10-CM | POA: Insufficient documentation

## 2015-08-11 DIAGNOSIS — R1031 Right lower quadrant pain: Secondary | ICD-10-CM

## 2015-08-11 DIAGNOSIS — N83202 Unspecified ovarian cyst, left side: Secondary | ICD-10-CM

## 2015-08-11 DIAGNOSIS — Y998 Other external cause status: Secondary | ICD-10-CM | POA: Insufficient documentation

## 2015-08-11 HISTORY — DX: Essential (primary) hypertension: I10

## 2015-08-11 LAB — URINALYSIS, ROUTINE W REFLEX MICROSCOPIC
BILIRUBIN URINE: NEGATIVE
Glucose, UA: NEGATIVE mg/dL
HGB URINE DIPSTICK: NEGATIVE
Ketones, ur: NEGATIVE mg/dL
Leukocytes, UA: NEGATIVE
Nitrite: NEGATIVE
PROTEIN: NEGATIVE mg/dL
Specific Gravity, Urine: 1.019 (ref 1.005–1.030)
UROBILINOGEN UA: 0.2 mg/dL (ref 0.0–1.0)
pH: 6.5 (ref 5.0–8.0)

## 2015-08-11 LAB — COMPREHENSIVE METABOLIC PANEL
ALT: 9 U/L — ABNORMAL LOW (ref 14–54)
AST: 22 U/L (ref 15–41)
Albumin: 3.9 g/dL (ref 3.5–5.0)
Alkaline Phosphatase: 54 U/L (ref 38–126)
Anion gap: 9 (ref 5–15)
BILIRUBIN TOTAL: 0.4 mg/dL (ref 0.3–1.2)
CALCIUM: 9 mg/dL (ref 8.9–10.3)
CO2: 23 mmol/L (ref 22–32)
CREATININE: 0.67 mg/dL (ref 0.44–1.00)
Chloride: 110 mmol/L (ref 101–111)
GFR calc Af Amer: >60 mL/min (ref >60)
Glucose, Bld: 96 mg/dL (ref 65–99)
POTASSIUM: 4.4 mmol/L (ref 3.5–5.1)
Sodium: 142 mmol/L (ref 135–145)
TOTAL PROTEIN: 7.5 g/dL (ref 6.5–8.1)

## 2015-08-11 LAB — WET PREP, GENITAL
TRICH WET PREP: NONE SEEN
Yeast Wet Prep HPF POC: NONE SEEN

## 2015-08-11 LAB — POC URINE PREG, ED: Preg Test, Ur: NEGATIVE

## 2015-08-11 LAB — LIPASE, BLOOD: LIPASE: 21 U/L — AB (ref 22–51)

## 2015-08-11 LAB — CBC WITH DIFFERENTIAL/PLATELET
BASOS ABS: 0 10*3/uL (ref 0.0–0.1)
BASOS PCT: 0 %
EOS ABS: 0 10*3/uL (ref 0.0–0.7)
EOS PCT: 0 %
HCT: 31.5 % — ABNORMAL LOW (ref 36.0–46.0)
Hemoglobin: 10.2 g/dL — ABNORMAL LOW (ref 12.0–15.0)
Lymphocytes Relative: 43 %
Lymphs Abs: 1.9 10*3/uL (ref 0.7–4.0)
MCH: 26.1 pg (ref 26.0–34.0)
MCHC: 32.4 g/dL (ref 30.0–36.0)
MCV: 80.6 fL (ref 78.0–100.0)
MONO ABS: 0.5 10*3/uL (ref 0.1–1.0)
Monocytes Relative: 10 %
Neutro Abs: 2 10*3/uL (ref 1.7–7.7)
Neutrophils Relative %: 47 %
PLATELETS: 335 10*3/uL (ref 150–400)
RBC: 3.91 MIL/uL (ref 3.87–5.11)
RDW: 16.1 % — AB (ref 11.5–15.5)
WBC: 4.5 10*3/uL (ref 4.0–10.5)

## 2015-08-11 MED ORDER — MORPHINE SULFATE (PF) 4 MG/ML IV SOLN
4.0000 mg | Freq: Once | INTRAVENOUS | Status: AC
  Administered 2015-08-11: 4 mg via INTRAVENOUS
  Filled 2015-08-11: qty 1

## 2015-08-11 MED ORDER — IOHEXOL 300 MG/ML  SOLN
25.0000 mL | Freq: Once | INTRAMUSCULAR | Status: AC | PRN
  Administered 2015-08-11: 25 mL via ORAL

## 2015-08-11 MED ORDER — DEXTROSE 5 % IV SOLN
1.0000 g | Freq: Once | INTRAVENOUS | Status: AC
  Administered 2015-08-11: 1 g via INTRAVENOUS
  Filled 2015-08-11: qty 10

## 2015-08-11 MED ORDER — DOXYCYCLINE HYCLATE 100 MG PO CAPS: 100.0000 mg | ORAL_CAPSULE | Freq: Two times a day (BID) | ORAL | Status: DC

## 2015-08-11 MED ORDER — HYDROCODONE-ACETAMINOPHEN 5-325 MG PO TABS: 1.0000 | ORAL_TABLET | Freq: Four times a day (QID) | ORAL | Status: DC | PRN

## 2015-08-11 MED ORDER — METRONIDAZOLE 500 MG PO TABS: 500.0000 mg | ORAL_TABLET | Freq: Two times a day (BID) | ORAL | Status: DC

## 2015-08-11 MED ORDER — PROMETHAZINE HCL 25 MG PO TABS: 25.0000 mg | ORAL_TABLET | Freq: Four times a day (QID) | ORAL | Status: DC | PRN

## 2015-08-11 MED ORDER — DOXYCYCLINE HYCLATE 100 MG PO TABS
100.0000 mg | ORAL_TABLET | Freq: Once | ORAL | Status: AC
  Administered 2015-08-11: 100 mg via ORAL
  Filled 2015-08-11: qty 1

## 2015-08-11 MED ORDER — ONDANSETRON HCL 4 MG/2ML IJ SOLN
4.0000 mg | Freq: Once | INTRAMUSCULAR | Status: AC
  Administered 2015-08-11: 4 mg via INTRAVENOUS
  Filled 2015-08-11: qty 2

## 2015-08-11 MED ORDER — IOHEXOL 300 MG/ML  SOLN
100.0000 mL | Freq: Once | INTRAMUSCULAR | Status: AC | PRN
  Administered 2015-08-11: 100 mL via INTRAVENOUS

## 2015-08-11 MED ORDER — ONDANSETRON 4 MG PO TBDP
4.0000 mg | ORAL_TABLET | Freq: Once | ORAL | Status: AC
  Administered 2015-08-11: 4 mg via ORAL
  Filled 2015-08-11: qty 1

## 2015-08-11 MED ORDER — SODIUM CHLORIDE 0.9 % IV BOLUS (SEPSIS)
1000.0000 mL | Freq: Once | INTRAVENOUS | Status: AC
  Administered 2015-08-11: 1000 mL via INTRAVENOUS

## 2015-08-11 MED ORDER — METRONIDAZOLE 500 MG PO TABS
500.0000 mg | ORAL_TABLET | Freq: Once | ORAL | Status: AC
  Administered 2015-08-11: 500 mg via ORAL
  Filled 2015-08-11: qty 1

## 2015-08-11 NOTE — Discharge Instructions (Signed)
Your studies today showed a fibroid on your uterus and a left sided ovarian cyst.  The antibiotics are for a possible pelvic infection.     Abdominal Pain Many things can cause abdominal pain. Usually, abdominal pain is not caused by a disease and will improve without treatment. It can often be observed and treated at home. Your health care provider will do a physical exam and possibly order blood tests and X-rays to help determine the seriousness of your pain. However, in many cases, more time must pass before a clear cause of the pain can be found. Before that point, your health care provider may not know if you need more testing or further treatment. HOME CARE INSTRUCTIONS  Monitor your abdominal pain for any changes. The following actions may help to alleviate any discomfort you are experiencing:  Only take over-the-counter or prescription medicines as directed by your health care provider.  Do not take laxatives unless directed to do so by your health care provider.  Try a clear liquid diet (broth, tea, or water) as directed by your health care provider. Slowly move to a bland diet as tolerated. SEEK MEDICAL CARE IF:  You have unexplained abdominal pain.  You have abdominal pain associated with nausea or diarrhea.  You have pain when you urinate or have a bowel movement.  You experience abdominal pain that wakes you in the night.  You have abdominal pain that is worsened or improved by eating food.  You have abdominal pain that is worsened with eating fatty foods.  You have a fever. SEEK IMMEDIATE MEDICAL CARE IF:   Your pain does not go away within 2 hours.  You keep throwing up (vomiting).  Your pain is felt only in portions of the abdomen, such as the right side or the left lower portion of the abdomen.  You pass bloody or black tarry stools. MAKE SURE YOU:  Understand these instructions.   Will watch your condition.   Will get help right away if you are not doing  well or get worse.  Document Released: 08/08/2005 Document Revised: 11/03/2013 Document Reviewed: 07/08/2013 [facility] Patient Information 2015 [place], [place]. This information is not intended to replace advice given to you by your health care provider. Make sure you discuss any questions you have with your health care provider.  Fibroids Fibroids are lumps (tumors) that can occur any place in a woman's body. These lumps are not cancerous. Fibroids vary in size, weight, and where they grow. HOME CARE  Do not take aspirin.  Write down the number of pads or tampons you use during your period. Tell your doctor. This can help determine the best treatment for you. GET HELP RIGHT AWAY IF:  You have pain in your lower belly (abdomen) that is not helped with medicine.  You have cramps that are not helped with medicine.  You have more bleeding between or during your period.  You feel lightheaded or pass out (faint).  Your lower belly pain gets worse. MAKE SURE YOU:  Understand these instructions.  Will watch your condition.  Will get help right away if you are not doing well or get worse. Document Released: 12/01/2010 Document Revised: 01/21/2012 Document Reviewed: 12/01/2010 [facility] Patient Information 2015 [place], [place]. This information is not intended to replace advice given to you by your health care provider. Make sure you discuss any questions you have with your health care provider.  Ovarian Cyst An ovarian cyst is a sac filled with fluid or blood.  This sac is attached to the ovary. Some cysts go away on their own. Other cysts need treatment.  HOME CARE   Only take medicine as told by your doctor.  Follow up with your doctor as told.  Get regular pelvic exams and Pap tests. GET HELP IF:  Your periods are late, not regular, or painful.  You stop having periods.  Your belly (abdominal) or pelvic pain does not go away.  Your belly becomes large or puffy  (swollen).  You have a hard time peeing (totally emptying your bladder).  You have pressure on your bladder.  You have pain during sex.  You feel fullness, pressure, or discomfort in your belly.  You lose weight for no reason.  You feel sick most of the time.  You have a hard time pooping (constipation).  You do not feel like eating.  You develop pimples (acne).  You have an increase in hair on your body and face.  You are gaining weight for no reason.  You think you are pregnant. GET HELP RIGHT AWAY IF:   Your belly pain gets worse.  You feel sick to your stomach (nauseous), and you throw up (vomit).  You have a fever that comes on fast.  You have belly pain while pooping (bowel movement).  Your periods are heavier than usual. MAKE SURE YOU:   Understand these instructions.  Will watch your condition.  Will get help right away if you are not doing well or get worse. Document Released: 04/16/2008 Document Revised: 08/19/2013 Document Reviewed: 07/06/2013 [facility] Patient Information 2015 [place], [place]. This information is not intended to replace advice given to you by your health care provider. Make sure you discuss any questions you have with your health care provider.

## 2015-08-11 NOTE — ED Notes (Signed)
Patient C/O abdominal pain that has been ongoing for some time.  States that it began to worsen Monday or Tuesday to the point that she could not ignore it any longer.  Pain is in her right flank and abdomen. Right flank is tender to palpation. Denies dysuria but does C/O occasional blood in urine.

## 2015-08-11 NOTE — ED Notes (Signed)
Pt states she vomited on the floor. Dr. Ralene Bathe notified.

## 2015-08-11 NOTE — ED Provider Notes (Signed)
CSN: [id number]     Arrival date & time 08/11/15  0901 History   First MD Initiated Contact with Patient 08/11/15 [phone]     Chief Complaint  Patient presents with  . Abdominal Pain     Patient is a 34 y.o. female presenting with abdominal pain. The history is provided by the patient. No language interpreter was used.  Abdominal Pain  Lisa [name] presents for evaluation of abdominal pain. She reports about 1 month of intermittent and progressive abdominal pain. Her symptoms became severe last night after eating a pork sausage. She reports pain across her central lower abdomen that radiates to her right flank. Pain is described as sharp and stabbing in nature. The pain is now constant. She vomited last night. No fevers, diarrhea, dysuria. She did have hematuria several days ago, none currently. She has a history of ulcers, no other medical problems. She's had a prior C-section. No other abdominal surgeries.  No past medical history on file. Past Surgical History  Procedure Laterality Date  . Mandible fracture surgery     No family history on file. Social History  Substance Use Topics  . Smoking status: Never Smoker   . Smokeless tobacco: Not on file  . Alcohol Use: Yes     Comment: occ   OB History    No data available     Review of Systems  Gastrointestinal: Positive for abdominal pain.  All other systems reviewed and are negative.     Allergies  Review of patient's allergies indicates no known allergies.  Home Medications   Prior to Admission medications   Medication Sig Start Date End Date Taking? Authorizing Provider  doxycycline (VIBRAMYCIN) 100 MG capsule Take 1 capsule (100 mg total) by mouth 2 (two) times daily. One po bid x 7 days 06/06/14   Cleatrice Burke, PA-C  ibuprofen (ADVIL,MOTRIN) 800 MG tablet Take 1 tablet (800 mg total) by mouth 3 (three) times daily. 06/06/14   Cleatrice Burke, PA-C  metroNIDAZOLE (FLAGYL) 500 MG tablet Take 1 tablet (500 mg total) by mouth  2 (two) times daily. One po bid x 7 days 06/06/14   Cleatrice Burke, PA-C  traMADol (ULTRAM) 50 MG tablet Take 1 tablet (50 mg total) by mouth every 6 (six) hours as needed. 06/06/14   Cleatrice Burke, PA-C   BP 135/95 mmHg  Pulse 84  Temp(Src) 98.7 F (37.1 C) (Oral)  Resp 18  SpO2 100% Physical Exam  Constitutional: She is oriented to person, place, and time. She appears well-developed and well-nourished.  HENT:  Head: Normocephalic and atraumatic.  Cardiovascular: Normal rate and regular rhythm.   No murmur heard. Pulmonary/Chest: Effort normal and breath sounds normal. No respiratory distress.  Abdominal: Soft. There is no rebound and no guarding.  Moderate tenderness over the right hemiabdomen, mild tenderness over the left abdomen. There is no guarding or rebound tenderness.  Genitourinary:  Pooling of dark blood on insertion of speculum, after blood was cleared a tampon was removed.  Mild vaginal bleeding present after removal, no recurrent pooling.  No significant discharge, mild to moderate suprapubic and RLQ tenderness  Musculoskeletal: She exhibits no edema or tenderness.  Neurological: She is alert and oriented to person, place, and time.  Skin: Skin is warm and dry.  Psychiatric: She has a normal mood and affect. Her behavior is normal.  Nursing note and vitals reviewed.   ED Course  Procedures (including critical care time) Labs Review Labs Reviewed  WET PREP, GENITAL - Abnormal;  Notable for the following:    Clue Cells Wet Prep HPF POC FEW (*)    WBC, Wet Prep HPF POC FEW (*)    All other components within normal limits  COMPREHENSIVE METABOLIC PANEL - Abnormal; Notable for the following:    BUN <5 (*)    ALT 9 (*)    All other components within normal limits  LIPASE, BLOOD - Abnormal; Notable for the following:    Lipase 21 (*)    All other components within normal limits  CBC WITH DIFFERENTIAL/PLATELET - Abnormal; Notable for the following:    Hemoglobin 10.2  (*)    HCT 31.5 (*)    RDW 16.1 (*)    All other components within normal limits  URINALYSIS, ROUTINE W REFLEX MICROSCOPIC (NOT AT [facility])  POC URINE PREG, ED  GC/CHLAMYDIA PROBE AMP ([place]) NOT AT [facility]    Imaging Review US Pelvis Complete  08/11/2015   CLINICAL DATA:  Right-sided pelvic pain  EXAM: TRANSABDOMINAL AND TRANSVAGINAL ULTRASOUND OF PELVIS  TECHNIQUE: Both transabdominal and transvaginal ultrasound examinations of the pelvis were performed. Transabdominal technique was performed for global imaging of the pelvis including uterus, ovaries, adnexal regions, and pelvic cul-de-sac. It was necessary to proceed with endovaginal exam following the transabdominal exam to visualize the ovaries.  COMPARISON:  CT from earlier in the same day  FINDINGS: Uterus  Measurements: 11.1 x 6.7 x 6.7 cm. 5 cm fibroid is noted within the uterus similar to that seen on prior CT examination.  Endometrium  Thickness: 5 mm.  No focal abnormality visualized.  Right ovary  Measurements: 3.3 x 2.1 x 2.1 cm. Normal appearance/no adnexal mass.  Left ovary  Measurements: 7.7 x 3.0 x 4.3 cm. Large cystic structure is noted within similar to that seen on recent CT examination. Some mild nodularity is noted along the wall.  IMPRESSION: Cystic lesion within the left ovary with some nodularity. Short-term follow-up in 4-6 weeks is recommended to assess for resolution.  Fibroid uterus.  The changes in the vaginal vault are not well appreciated on this exam.   Electronically Signed   By: Inez Catalina M.D.   On: 08/11/2015 16:45   Ct Abdomen Pelvis W Contrast  08/11/2015   CLINICAL DATA:  Worsening abdominal pain over the past 2 days.  EXAM: CT ABDOMEN AND PELVIS WITH CONTRAST  TECHNIQUE: Multidetector CT imaging of the abdomen and pelvis was performed using the standard protocol following bolus administration of intravenous contrast.  CONTRAST:  170mL OMNIPAQUE IOHEXOL 300 MG/ML  SOLN  COMPARISON:  Pelvic ultrasound  06/08/2014  FINDINGS: Lower chest: The lung bases are clear of acute process. No pleural effusion or pulmonary lesions. The heart is normal in size. No pericardial effusion. The distal esophagus and aorta are unremarkable.  Hepatobiliary: No focal hepatic lesions or intrahepatic biliary dilatation. Small gallstones are noted in the gallbladder. No common bile duct dilatation.  Pancreas: No mass, inflammation or ductal dilatation PE  Spleen: Normal size.  No focal lesions.  Adrenals/Urinary Tract: The adrenal glands are normal.  The right kidney demonstrates moderate hydronephrosis. There is also moderate right hydroureter down to the pelvic inlet. The ureter is compressed between 80 right-sided uterine fibroid and the right psoas muscle. No obstructing ureteral calculus. No bladder calculi.  The left kidney is normal except for a small upper pole cyst.  Stomach/Bowel: The stomach, duodenum, small bowel and colon are unremarkable. No inflammatory changes, mass lesions or obstructive findings. The terminal ileum is normal.  The appendix is normal.  Vascular/Lymphatic: No mesenteric or retroperitoneal mass or adenopathy. The aorta is normal in caliber. The branch vessels are normal. The major venous structures are patent.  Other: The vagina is distended with complex fluid and air containing debris. This could be a foreign body or some type of menstrual pad. It does not have the typical appearance of a tampon. Could not exclude the possibility of stool but I do not see an obvious rectal vaginal fistula. Recommend correlation with pelvic examination. There also prominent vessels and some enhancement of the vaginal wall.  The uterus is enlarged and there are mural fibroids. The endometrium appears grossly normal.  There is a 4.5 cm simple appearing left ovarian cyst. The right ovary appears normal.  No pelvic adenopathy and no inguinal adenopathy.  Musculoskeletal: No significant bony findings.  IMPRESSION: 1. Distended  vagina containing complex fluid and some type of complex debris or foreign body. Recommend correlation with physical examination/pelvic examination. 2. Right-sided hydroureteronephrosis with probable compression of the right ureter between an enlarged uterine fibroid and the right psoas muscle. 3. Enlarged fibroid uterus. 4. Simple appearing 4.5 cm left ovarian cyst.   Electronically Signed   By: Marijo Sanes M.D.   On: 08/11/2015 12:46   I have personally reviewed and evaluated these images and lab results as part of my medical decision-making.   EKG Interpretation None      MDM   Final diagnoses:  Retained tampon, initial encounter  Uterine leiomyoma, unspecified location  Cyst of left ovary  Right lower quadrant abdominal pain    Patient here for a violation of abdominal pain that is progressing over the last month. Patient with significant tenderness on initial violation, this did improve after morphine. CT scan with complex fluid and debris in the vagina. Pelvic examination demonstrated retained tampon. Patient is unsure when she last placed a tampon but it has been at least a couple of months. There was no visible vaginal discharge after removal of the tampon. She does have pelvic tenderness on examination. Treating for possible pelvic infection given her tenderness. Discussed with patient findings of uterine fibroid, ovarian cyst, importance of OB/GYN follow-up for repeat evaluation. Patient did have one episode of emesis in the emergency department after being given oral medications. On recheck she is feeling improved, tolerating oral fluids. Discussed outpatient follow-up and return precautions.  Quintella Reichert, MD 08/11/15 [phone]

## 2015-08-12 LAB — GC/CHLAMYDIA PROBE AMP (~~LOC~~) NOT AT ARMC
CHLAMYDIA, DNA PROBE: NEGATIVE
NEISSERIA GONORRHEA: NEGATIVE

## 2015-12-28 ENCOUNTER — Inpatient Hospital Stay (HOSPITAL_COMMUNITY)
Admission: EM | Admit: 2015-12-28 | Discharge: 2016-01-02 | DRG: 060 | Disposition: A | Discharge status: Home / Self Care | Payer: No Typology Code available for payment source | Attending: Family Medicine | Admitting: Family Medicine

## 2015-12-28 ENCOUNTER — Encounter (HOSPITAL_COMMUNITY): Payer: Self-pay | Admitting: Emergency Medicine

## 2015-12-28 ENCOUNTER — Emergency Department (HOSPITAL_COMMUNITY): Payer: No Typology Code available for payment source

## 2015-12-28 DIAGNOSIS — E876 Hypokalemia: Secondary | ICD-10-CM | POA: Diagnosis present

## 2015-12-28 DIAGNOSIS — R29818 Other symptoms and signs involving the nervous system: Secondary | ICD-10-CM | POA: Diagnosis present

## 2015-12-28 DIAGNOSIS — R200 Anesthesia of skin: Secondary | ICD-10-CM

## 2015-12-28 DIAGNOSIS — G379 Demyelinating disease of central nervous system, unspecified: Principal | ICD-10-CM | POA: Diagnosis present

## 2015-12-28 DIAGNOSIS — R9082 White matter disease, unspecified: Secondary | ICD-10-CM | POA: Insufficient documentation

## 2015-12-28 DIAGNOSIS — R5381 Other malaise: Secondary | ICD-10-CM | POA: Insufficient documentation

## 2015-12-28 DIAGNOSIS — Z8249 Family history of ischemic heart disease and other diseases of the circulatory system: Secondary | ICD-10-CM

## 2015-12-28 DIAGNOSIS — R202 Paresthesia of skin: Secondary | ICD-10-CM

## 2015-12-28 DIAGNOSIS — D649 Anemia, unspecified: Secondary | ICD-10-CM | POA: Diagnosis present

## 2015-12-28 DIAGNOSIS — I10 Essential (primary) hypertension: Secondary | ICD-10-CM | POA: Diagnosis present

## 2015-12-28 HISTORY — DX: Migraine without aura, not intractable, without status migrainosus: G43.009

## 2015-12-28 LAB — CSF CELL COUNT WITH DIFFERENTIAL
RBC COUNT CSF: 0 /mm3
RBC Count, CSF: 6 /mm3 — ABNORMAL HIGH
Tube #: 4
WBC CSF: 1 /mm3 (ref 0–5)
WBC, CSF: 0 /mm3 (ref 0–5)

## 2015-12-28 LAB — BASIC METABOLIC PANEL
ANION GAP: 12 (ref 5–15)
BUN: 9 mg/dL (ref 6–20)
CALCIUM: 9.4 mg/dL (ref 8.9–10.3)
CO2: 22 mmol/L (ref 22–32)
Chloride: 105 mmol/L (ref 101–111)
Creatinine, Ser: 0.87 mg/dL (ref 0.44–1.00)
Glucose, Bld: 114 mg/dL — ABNORMAL HIGH (ref 65–99)
POTASSIUM: 3.4 mmol/L — AB (ref 3.5–5.1)
SODIUM: 139 mmol/L (ref 135–145)

## 2015-12-28 LAB — I-STAT BETA HCG BLOOD, ED (MC, WL, AP ONLY)

## 2015-12-28 LAB — CBC
HCT: 35.9 % — ABNORMAL LOW (ref 36.0–46.0)
HEMATOCRIT: 30.9 % — AB (ref 36.0–46.0)
Hemoglobin: 11.4 g/dL — ABNORMAL LOW (ref 12.0–15.0)
Hemoglobin: 10.4 g/dL — ABNORMAL LOW (ref 12.0–15.0)
MCH: 26.1 pg (ref 26.0–34.0)
MCH: 27.4 pg (ref 26.0–34.0)
MCHC: 31.8 g/dL (ref 30.0–36.0)
MCHC: 33.7 g/dL (ref 30.0–36.0)
MCV: 82.2 fL (ref 78.0–100.0)
MCV: 81.5 fL (ref 78.0–100.0)
PLATELETS: 350 10*3/uL (ref 150–400)
PLATELETS: 281 10*3/uL (ref 150–400)
RBC: 4.37 MIL/uL (ref 3.87–5.11)
RBC: 3.79 MIL/uL — AB (ref 3.87–5.11)
RDW: 15.1 % (ref 11.5–15.5)
RDW: 14.9 % (ref 11.5–15.5)
WBC: 5.7 10*3/uL (ref 4.0–10.5)
WBC: 4.9 10*3/uL (ref 4.0–10.5)

## 2015-12-28 LAB — CREATININE, SERUM: Creatinine, Ser: 0.69 mg/dL (ref 0.44–1.00)

## 2015-12-28 LAB — PROTEIN AND GLUCOSE, CSF
Glucose, CSF: 58 mg/dL (ref 40–70)
Total  Protein, CSF: 21 mg/dL (ref 15–45)

## 2015-12-28 LAB — PROTIME-INR
INR: 1.13 (ref 0.00–1.49)
PROTHROMBIN TIME: 14.7 s (ref 11.6–15.2)

## 2015-12-28 LAB — CRYPTOCOCCAL ANTIGEN, CSF: Crypto Ag: NEGATIVE

## 2015-12-28 LAB — TSH: TSH: 0.356 u[IU]/mL (ref 0.350–4.500)

## 2015-12-28 LAB — SEDIMENTATION RATE: SED RATE: 15 mm/h (ref 0–22)

## 2015-12-28 MED ORDER — ENOXAPARIN SODIUM 40 MG/0.4ML ~~LOC~~ SOLN
40.0000 mg | SUBCUTANEOUS | Status: DC
  Administered 2015-12-28 – 2016-01-01 (×5): 40 mg via SUBCUTANEOUS
  Filled 2015-12-28 (×5): qty 0.4

## 2015-12-28 MED ORDER — LIDOCAINE HCL (PF) 1 % IJ SOLN
INTRAMUSCULAR | Status: AC
  Filled 2015-12-28: qty 5

## 2015-12-28 MED ORDER — PANTOPRAZOLE SODIUM 40 MG IV SOLR
40.0000 mg | INTRAVENOUS | Status: DC
  Administered 2015-12-28 – 2015-12-29 (×2): 40 mg via INTRAVENOUS
  Filled 2015-12-28 (×2): qty 40

## 2015-12-28 MED ORDER — SODIUM CHLORIDE 0.9 % IV SOLN
1000.0000 mL | Freq: Once | INTRAVENOUS | Status: AC
  Administered 2015-12-28: 1000 mL via INTRAVENOUS

## 2015-12-28 MED ORDER — SODIUM CHLORIDE 0.9 % IV SOLN
250.0000 mg | Freq: Four times a day (QID) | INTRAVENOUS | Status: DC
  Administered 2015-12-28 – 2015-12-30 (×8): 250 mg via INTRAVENOUS
  Filled 2015-12-28 (×10): qty 2

## 2015-12-28 MED ORDER — POTASSIUM CHLORIDE CRYS ER 20 MEQ PO TBCR
40.0000 meq | EXTENDED_RELEASE_TABLET | Freq: Two times a day (BID) | ORAL | Status: DC
  Administered 2015-12-28: 40 meq via ORAL
  Filled 2015-12-28: qty 2

## 2015-12-28 MED ORDER — KETOROLAC TROMETHAMINE 30 MG/ML IJ SOLN
30.0000 mg | Freq: Once | INTRAMUSCULAR | Status: AC
  Administered 2015-12-28: 30 mg via INTRAVENOUS
  Filled 2015-12-28: qty 1

## 2015-12-28 MED ORDER — ACETAMINOPHEN 325 MG PO TABS
650.0000 mg | ORAL_TABLET | Freq: Four times a day (QID) | ORAL | Status: DC | PRN
  Administered 2015-12-28 – 2016-01-01 (×8): 650 mg via ORAL
  Filled 2015-12-28 (×7): qty 2

## 2015-12-28 MED ORDER — GADOBENATE DIMEGLUMINE 529 MG/ML IV SOLN
15.0000 mL | Freq: Once | INTRAVENOUS | Status: AC
  Administered 2015-12-28: 13 mL via INTRAVENOUS

## 2015-12-28 MED ORDER — METOCLOPRAMIDE HCL 5 MG/ML IJ SOLN
10.0000 mg | Freq: Once | INTRAMUSCULAR | Status: AC
  Administered 2015-12-28: 10 mg via INTRAVENOUS
  Filled 2015-12-28: qty 2

## 2015-12-28 MED ORDER — SODIUM CHLORIDE 0.9 % IV SOLN
INTRAVENOUS | Status: DC
  Administered 2015-12-28: 09:00:00 via INTRAVENOUS

## 2015-12-28 MED ORDER — DIPHENHYDRAMINE HCL 50 MG/ML IJ SOLN
25.0000 mg | Freq: Once | INTRAMUSCULAR | Status: AC
  Administered 2015-12-28: 25 mg via INTRAVENOUS
  Filled 2015-12-28: qty 1

## 2015-12-28 NOTE — ED Notes (Signed)
Patient states started having intermittent L sided facial pain and heaviness x 1 week ago that sometimes wakes her up out of her sleep.   Patient states she started having L eye blurriness approximately the same time.   Patient denies any other symptoms.   Patient denies dizziness, denies lightheadedness, denies V/D, but does state she had some nausea previously.   Denies neuro deficits.   Patient states took ibuprofen and tylenol for facial pain at home with little relief.   No neuro deficits.   Denies weakness, no numbness, no facial asymmetry.

## 2015-12-28 NOTE — H&P (Signed)
[facility] Admission History and Physical Service Pager: [phone]  Patient name: Lisa [name] Medical record number: [id number] Date of birth: 12-13-1980 Age: 35 y.o. Gender: female  Primary Care Provider: Frederico Hamman, MD Consultants: Neurology Code Status: Full (per discussion on admission)  Chief Complaint: left facial weakness/numbness  Assessment and Plan: Lisa [name] is a 35 y.o. female presenting with left facial numbness/weakness . PMH is significant for HTN  Left facial numbness/weakness: Left facial pain x 5 days and left facial weakness x 2 days. Currently not experiencing any facial pain but still has numbness around CN V2/V3 distribution. Labs unremarkable except for minor hypokalemia of 3.4, hemoglobin of 11.4. CSF studies WNL.EKG WNL. MRI head showed no CVA but did show "Nonspecific solitary 6 mm T2 and FLAIR hyperintense lesion with a small 2-3 mm focus of central enhancement at the right cingulate gyrus subcortical white matter as well as nonspecific generalized appearing cerebral volume loss for age." MRA unremarkable. S/p Toradol in ED. Differentials include Multiple Sclerosis vs complex migraine vs Trigeminal neuralgia vs early shingles. Neurology consulted.   - Admit to [facility], attending Dr. Andria Frames - Vitals per floor protocol - Neuro recs  - IV steroids - Continue to monitor symptoms - Tylenol for pain if it arises again - HIV in process - s/p lumbar puncture. Follow-up CSF labs  Hypokalemia: 3.2 on admission - Kdur 19meq x1  Anemia: appears at baseline, currently at 10.4. Normocytic. No previous work up. Possibly secondary to menarche - follow-up outpatient - may benefit from iron studies  Thyromegaly seen on MRI cervical spine: MRI cervical spine showing generalized thyromegaly. Pt does admit to recent heat intolerance. No previous thyroid studies in Epic or hx of thyroid abnormality. Physical exam does not show obvious  thyromegaly or nodules - TSH  FEN/GI: SLIV, regular diet Prophylaxis: Lovenox  Disposition: Admit to [facility], attending Dr. Andria Frames  History of Present Illness:  Lisa [name] is a 35 y.o. female presenting with left facial numbness and weakness. About one week ago, she started having left sided facial numbness, weakness and pain. Pain was mainly behind her eye and migrated down her left face. She developed some drooping around her cheek about two days ago. Because her symptoms progressed over the last week, she decided to come to the ED for evaluation. She reports no associated vision issues. Symptoms have especially affected her at night while sleeping. She has intermittent headaches that improve with BC powder. She has no new issues with hearing. She has some numbness in her left arm that is intermittent. No changes with taste, smell. No dysphagia. Intermittent lightheadedness. No gait abnormalities or falls. No chest pain, or palpitations, but states she has gotten a feeling of needing to catch her breath momentarily. She reports intermittent nausea. No vomiting. No urinary/fecal incontinence.   Review Of Systems: Per HPI with the following additions: see HPI Otherwise the remainder of the systems were negative.  Patient Active Problem List   Diagnosis Date Noted  . Neurologic abnormality 12/28/2015    Past Medical History: Past Medical History  Diagnosis Date  . Hypertension   . Migraine without aura     Past Surgical History: Past Surgical History  Procedure Laterality Date  . Mandible fracture surgery    . Cesarean section      Social History: Social History  Substance Use Topics  . Smoking status: Never Smoker   . Smokeless tobacco: None  . Alcohol Use: 1.2 oz/week  2 Standard drinks or equivalent per week     Comment: socially   Please also refer to relevant sections of EMR.  Family History: Family History  Problem Relation Age of Onset  . Hypertension Mother    . Hypertension Father   . Hyperlipidemia Father   . Hyperlipidemia Mother   . Migraines Mother      Allergies and Medications: No Known Allergies No current facility-administered medications on file prior to encounter.   Current Outpatient Prescriptions on File Prior to Encounter  Medication Sig Dispense Refill  . ibuprofen (ADVIL,MOTRIN) 800 MG tablet Take 1 tablet (800 mg total) by mouth 3 (three) times daily. 21 tablet 0    Objective: BP 142/90 mmHg  Pulse 87  Temp(Src) 98.7 F (37.1 C) (Oral)  Resp 18  Ht 5\' 5"  (1.651 m)  Wt 142 lb (64.411 kg)  BMI 23.63 kg/m2  SpO2 100%  LMP 12/05/2015  Exam: General: In NAD, laying in bed alert and oriented Eyes: PERRLA, non icteric sclera ENTM: moist mucous membranes Neck: supple, no thyromegaly or nodules palpated Cardiovascular: RRR, normal s1 and s1, no murmurs Respiratory: normal work of breathing, clear to auscultation bilaterally Abdomen: soft, non distended, non tender, no masses, normal bowel sounds MSK: no joint tenderness or deformities Skin: no rashes Neuro: Alert and oriented x3, CN 2-12 intact except for decreased sensation of V2/V3 distribution on left side of face, 5/5 strength in bilateral UE and LE, sensation intact throughout other than V2/V3 mentioned above.  Psych: normal mood and affect  Labs and Imaging: CBC BMET   Recent Labs Lab 12/28/15 0739  WBC 5.7  HGB 11.4*  HCT 35.9*  PLT 350    Recent Labs Lab 12/28/15 0739  NA 139  K 3.4*  CL 105  CO2 22  BUN 9  CREATININE 0.87  GLUCOSE 114*  CALCIUM 9.4     Mr [name] Contrast  12/28/2015  CLINICAL DATA:  35 year old female with headache, vertigo, left face and arm numbness. Initial encounter. EXAM: MRI HEAD WITHOUT AND WITH CONTRAST MRA HEAD WITHOUT CONTRAST TECHNIQUE: Multiplanar, multiecho pulse sequences of the brain and surrounding structures were obtained without and with intravenous contrast. Angiographic images of the head were  obtained using MRA technique without contrast. CONTRAST:  43mL MULTIHANCE GADOBENATE DIMEGLUMINE 529 MG/ML IV SOLN COMPARISON:  None. FINDINGS: MRI HEAD FINDINGS No restricted diffusion to suggest acute infarction. No midline shift, mass effect, evidence of mass lesion, ventriculomegaly, extra-axial collection or acute intracranial hemorrhage. Cervicomedullary junction and pituitary are within normal limits. Negative visualized cervical spine. Major intracranial vascular flow voids are within normal limits. Cerebral volume seems diminished for age diffusely. No cortical encephalomalacia. There is nonspecific T2 and FLAIR hyperintensity in the right cingulate gyrus and/or subcortical white matter of that gyrus (series 8, image 16 and series 11, image 8) with an associated 3 mm focus of abnormal enhancement there. No diffusion changes are evident. No regional mass effect. No other abnormal gray or white matter signal identified. No other abnormal intracranial enhancement identified. No dural thickening. No cortical encephalomalacia or chronic cerebral blood products. Visible internal auditory structures appear normal. Mastoids are clear. Trace paranasal sinus mucosal thickening. Orbit and scalp soft tissues appear normal. Visualized bone marrow signal is within normal limits. MRA HEAD FINDINGS Very diminutive distal right vertebral artery, but with preserved flow signal to the vertebrobasilar junction. The distal left vertebral artery appears normal. Normal AICA origins. Normal basilar artery with fetal type bilateral PCA origins. Normal  SCA origins. Bilateral PCA branches are within normal limits. Antegrade flow in both ICA siphons. No siphon stenosis. Normal ophthalmic and posterior communicating artery origins. Normal carotid termini. Normal MCA and ACA origins. Anterior communicating artery and visualized bilateral ACA branches are within normal limits. The right ACA is within normal limits. Visualized bilateral  MCA branches are within normal limits. IMPRESSION: 1. Nonspecific solitary 6 mm T2 and FLAIR hyperintense lesion with a small 2-3 mm focus of central enhancement at the right cingulate gyrus subcortical white matter. No associated diffusion changes to suggest a small acute infarct. No abnormality of the right ACA associated. No significant mass effect. No other acute intracranial abnormality and normal brain signal elsewhere. Top differential Considerations include focal demyelination, infection, non infectious inflammation (e.g. sarcoid), less likely neoplasm. 2. Nonspecific generalized appearing cerebral volume loss for age. Is there an underlying chronic disease state which might explain this? 3. Negative anterior circulation on intracranial MRA. Posterior circulation remarkable for diminutive appearance of the distal right vertebral artery which might be normal anatomic variation. 4. Cervical spine MRI reported separately. Electronically Signed   By: Genevie Ann M.D.   On: 12/28/2015 11:46   Mr Cervical Spine W Wo Contrast  12/28/2015  CLINICAL DATA:  35 year old female with acute onset vertigo, numbness, tingling. Abnormal brain MRI. Initial encounter. EXAM: MRI CERVICAL SPINE WITHOUT AND WITH CONTRAST TECHNIQUE: Multiplanar and multiecho pulse sequences of the cervical spine, to include the craniocervical junction and cervicothoracic junction, were obtained according to standard protocol without and with intravenous contrast. CONTRAST:  78mL MULTIHANCE GADOBENATE DIMEGLUMINE 529 MG/ML IV SOLN in conjunction with contrast enhanced imaging of the brain reported separately. COMPARISON:  Brain MRI and intracranial MRA from today reported separately. FINDINGS: Generalized Thyromegaly, but no discrete thyroid nodule or mass identified. The entire thyroid is not included on these images. The right vertebral artery appears diminutive throughout the neck, and also appears to have a late entry into the right cervical  transverse foramen. Straightening of cervical lordosis. No marrow edema or evidence of acute osseous abnormality. Cervicomedullary junction is within normal limits. Cervical and visualized upper thoracic spinal cord signal is normal. No abnormal intradural enhancement. Minimal disc bulging at C5-C6. Mild left facet hypertrophy at that level. However, no associated stenosis and no cervical spine degeneration elsewhere. Fairly capacious cervical and visualized upper thoracic thecal sac. IMPRESSION: 1. Negative MRI appearance of the cervical spine and spinal cord. No significant cervical spine degeneration. 2. Generalized thyromegaly. The entire thyroid is not included, but no discrete thyroid nodule or mass is identified. Recommend correlation with endocrine labs and thyroid ultrasound follow-up. 3. The right vertebral artery appears diminutive throughout its course, suggesting normal anatomic variation. Results of this exam plus the MRI/MRA of the brain reported separately (please see that report)) were discussed by telephone with Neurology Provider DAVID SMITH on 12/28/2015 at 1156 hours. Electronically Signed   By: Genevie Ann M.D.   On: 12/28/2015 11:59   Mr Virgel Paling (cerebral Arteries)  12/28/2015  CLINICAL DATA:  35 year old female with headache, vertigo, left face and arm numbness. Initial encounter. EXAM: MRI HEAD WITHOUT AND WITH CONTRAST MRA HEAD WITHOUT CONTRAST TECHNIQUE: Multiplanar, multiecho pulse sequences of the brain and surrounding structures were obtained without and with intravenous contrast. Angiographic images of the head were obtained using MRA technique without contrast. CONTRAST:  29mL MULTIHANCE GADOBENATE DIMEGLUMINE 529 MG/ML IV SOLN COMPARISON:  None. FINDINGS: MRI HEAD FINDINGS No restricted diffusion to suggest acute infarction. No midline shift, mass  effect, evidence of mass lesion, ventriculomegaly, extra-axial collection or acute intracranial hemorrhage. Cervicomedullary junction and  pituitary are within normal limits. Negative visualized cervical spine. Major intracranial vascular flow voids are within normal limits. Cerebral volume seems diminished for age diffusely. No cortical encephalomalacia. There is nonspecific T2 and FLAIR hyperintensity in the right cingulate gyrus and/or subcortical white matter of that gyrus (series 8, image 16 and series 11, image 8) with an associated 3 mm focus of abnormal enhancement there. No diffusion changes are evident. No regional mass effect. No other abnormal gray or white matter signal identified. No other abnormal intracranial enhancement identified. No dural thickening. No cortical encephalomalacia or chronic cerebral blood products. Visible internal auditory structures appear normal. Mastoids are clear. Trace paranasal sinus mucosal thickening. Orbit and scalp soft tissues appear normal. Visualized bone marrow signal is within normal limits. MRA HEAD FINDINGS Very diminutive distal right vertebral artery, but with preserved flow signal to the vertebrobasilar junction. The distal left vertebral artery appears normal. Normal AICA origins. Normal basilar artery with fetal type bilateral PCA origins. Normal SCA origins. Bilateral PCA branches are within normal limits. Antegrade flow in both ICA siphons. No siphon stenosis. Normal ophthalmic and posterior communicating artery origins. Normal carotid termini. Normal MCA and ACA origins. Anterior communicating artery and visualized bilateral ACA branches are within normal limits. The right ACA is within normal limits. Visualized bilateral MCA branches are within normal limits. IMPRESSION: 1. Nonspecific solitary 6 mm T2 and FLAIR hyperintense lesion with a small 2-3 mm focus of central enhancement at the right cingulate gyrus subcortical white matter. No associated diffusion changes to suggest a small acute infarct. No abnormality of the right ACA associated. No significant mass effect. No other acute  intracranial abnormality and normal brain signal elsewhere. Top differential Considerations include focal demyelination, infection, non infectious inflammation (e.g. sarcoid), less likely neoplasm. 2. Nonspecific generalized appearing cerebral volume loss for age. Is there an underlying chronic disease state which might explain this? 3. Negative anterior circulation on intracranial MRA. Posterior circulation remarkable for diminutive appearance of the distal right vertebral artery which might be normal anatomic variation. 4. Cervical spine MRI reported separately. Electronically Signed   By: Genevie Ann M.D.   On: 12/28/2015 11:46     [name], MD 12/28/2015, 1:07 PM PGY-1, [facility] pager: [phone], text pages welcome  I have seen and examined the patient. I have read and agree with the above note. My changes are noted in blue.  Cordelia Poche, MD PGY-3, [facility] 12/28/2015, 7:47 PM

## 2015-12-28 NOTE — ED Notes (Signed)
Spoke with Dr. Johnney Killian to discuss orders.   At this time, only CBC, BMP and urine.

## 2015-12-28 NOTE — ED Provider Notes (Signed)
CSN: [id number]     Arrival date & time 12/28/15  0730 History   First MD Initiated Contact with Patient 12/28/15 0809     Chief Complaint  Patient presents with  . Facial Pain  . facial heaviness   . Blurred Vision     (Consider location/radiation/quality/duration/timing/severity/associated sxs/prior Treatment) HPI One week ago patient began having left-sided symptoms of facial numbness and pain. She reports she has pain behind the left eye. Pain may radiate up from behind her ear toward her face. She has had some blurriness of the left eye that has come and gone. No double vision. He was a left sided face is heavy. She is also noted left arm heaviness and feeling slightly weaker than the right. This has come and gone as well.The arm has not been dysfunctional. No lower extremity complaints or gait difficulty. No vomiting. She does reports she's had some dizziness as been coming and going as well. She reports she just rests and tries ibuprofen or Tylenol. She is not getting much relief with these. He denies ever having had similar symptoms. No headache history. Otherwise healthy without medical problems. Past Medical History  Diagnosis Date  . Hypertension   . Migraine without aura    Past Surgical History  Procedure Laterality Date  . Mandible fracture surgery    . Cesarean section     Family History  Problem Relation Age of Onset  . Hypertension Mother   . Hypertension Father   . Hyperlipidemia Father   . Hyperlipidemia Mother   . Migraines Mother    Social History  Substance Use Topics  . Smoking status: Never Smoker   . Smokeless tobacco: None  . Alcohol Use: 1.2 oz/week    2 Standard drinks or equivalent per week     Comment: socially   OB History    No data available     Review of Systems 10 Systems reviewed and are negative for acute change except as noted in the HPI.    Allergies  Review of patient's allergies indicates no known allergies.  Home  Medications   Prior to Admission medications   Medication Sig Start Date End Date Taking? Authorizing Provider  ibuprofen (ADVIL,MOTRIN) 800 MG tablet Take 1 tablet (800 mg total) by mouth 3 (three) times daily. 06/06/14  Yes Cleatrice Burke, PA-C  Multiple Vitamin (MULTIVITAMIN WITH MINERALS) TABS tablet Take 1 tablet by mouth daily.   Yes Historical Provider, MD   BP 142/90 mmHg  Pulse 87  Temp(Src) 98.7 F (37.1 C) (Oral)  Resp 18  Ht 5\' 5"  (1.651 m)  Wt 142 lb (64.411 kg)  BMI 23.63 kg/m2  SpO2 100%  LMP 12/05/2015 Physical Exam  Constitutional: She is oriented to person, place, and time. She appears well-developed and well-nourished.  HENT:  Head: Normocephalic and atraumatic.  Right Ear: External ear normal.  Left Ear: External ear normal.  Nose: Nose normal.  Mouth/Throat: Oropharynx is clear and moist.  Bilateral TMs normal. Patient does endorse tenderness to palpation over the left temple and zygoma. No evident facial swelling or asymmetry. Normal range of motion of the jaw without trismus. No rashes. Neck is supple without lymphadenopathy.  Eyes: EOM are normal. Pupils are equal, round, and reactive to light. Right eye exhibits no discharge. Left eye exhibits no discharge.  Neck: Neck supple.  Cardiovascular: Normal rate, regular rhythm, normal heart sounds and intact distal pulses.   Pulmonary/Chest: Effort normal and breath sounds normal.  Abdominal: Soft. Bowel sounds  are normal. She exhibits no distension. There is no tenderness.  Musculoskeletal: Normal range of motion. She exhibits no edema.  Neurological: She is alert and oriented to person, place, and time. She has normal strength. Coordination normal. GCS eye subscore is 4. GCS verbal subscore is 5. GCS motor subscore is 6.  Cranial nerve function is symmetric for motor testing. Patient endorses sensory difference to light touch on the left versus the right side of the face. Tongue protrusion is midline. Grip  strength on left versus right very mild differential. Left 4/5 right 5/5  Skin: Skin is warm, dry and intact.  Psychiatric: She has a normal mood and affect.    ED Course  .Lumbar Puncture Date/Time: 12/28/2015 1:59 PM Performed by: Charlesetta Shanks Authorized by: Charlesetta Shanks Consent: Verbal consent obtained. Written consent obtained. Risks and benefits: risks, benefits and alternatives were discussed Consent given by: patient Patient understanding: patient states understanding of the procedure being performed Indications: brain lesion/possible MS. Anesthesia: local infiltration Local anesthetic: lidocaine 1% without epinephrine Anesthetic total: 4 ml Patient sedated: no Lumbar space: L4-L5 interspace Patient's position: sitting Needle gauge: 20 Needle type: diamond point Needle length: 3.5 in Number of attempts: 1 Fluid appearance: clear Tubes of fluid: 4 Post-procedure: site cleaned and adhesive bandage applied Patient tolerance: Patient tolerated the procedure well with no immediate complications   (including critical care time)  Labs Review Labs Reviewed  CBC - Abnormal; Notable for the following:    Hemoglobin 11.4 (*)    HCT 35.9 (*)    All other components within normal limits  BASIC METABOLIC PANEL - Abnormal; Notable for the following:    Potassium 3.4 (*)    Glucose, Bld 114 (*)    All other components within normal limits  CSF CULTURE  SEDIMENTATION RATE  PROTIME-INR  URINALYSIS, ROUTINE W REFLEX MICROSCOPIC (NOT AT [facility])  CSF CELL COUNT WITH DIFFERENTIAL  CSF IGG INDEX  OLIGOCLONAL BANDS, CSF + SERM  PROTEIN AND GLUCOSE, CSF  VDRL, CSF  CRYPTOCOCCAL ANTIGEN, CSF  HIV ANTIBODY (ROUTINE TESTING)  I-STAT BETA HCG BLOOD, ED ([facility], [facility], [facility] ONLY)    Imaging Review Mr [name] Contrast  12/28/2015  CLINICAL DATA:  35 year old female with headache, vertigo, left face and arm numbness. Initial encounter. EXAM: MRI HEAD WITHOUT AND WITH CONTRAST MRA HEAD  WITHOUT CONTRAST TECHNIQUE: Multiplanar, multiecho pulse sequences of the brain and surrounding structures were obtained without and with intravenous contrast. Angiographic images of the head were obtained using MRA technique without contrast. CONTRAST:  22mL MULTIHANCE GADOBENATE DIMEGLUMINE 529 MG/ML IV SOLN COMPARISON:  None. FINDINGS: MRI HEAD FINDINGS No restricted diffusion to suggest acute infarction. No midline shift, mass effect, evidence of mass lesion, ventriculomegaly, extra-axial collection or acute intracranial hemorrhage. Cervicomedullary junction and pituitary are within normal limits. Negative visualized cervical spine. Major intracranial vascular flow voids are within normal limits. Cerebral volume seems diminished for age diffusely. No cortical encephalomalacia. There is nonspecific T2 and FLAIR hyperintensity in the right cingulate gyrus and/or subcortical white matter of that gyrus (series 8, image 16 and series 11, image 8) with an associated 3 mm focus of abnormal enhancement there. No diffusion changes are evident. No regional mass effect. No other abnormal gray or white matter signal identified. No other abnormal intracranial enhancement identified. No dural thickening. No cortical encephalomalacia or chronic cerebral blood products. Visible internal auditory structures appear normal. Mastoids are clear. Trace paranasal sinus mucosal thickening. Orbit and scalp soft tissues appear normal. Visualized bone  marrow signal is within normal limits. MRA HEAD FINDINGS Very diminutive distal right vertebral artery, but with preserved flow signal to the vertebrobasilar junction. The distal left vertebral artery appears normal. Normal AICA origins. Normal basilar artery with fetal type bilateral PCA origins. Normal SCA origins. Bilateral PCA branches are within normal limits. Antegrade flow in both ICA siphons. No siphon stenosis. Normal ophthalmic and posterior communicating artery origins. Normal  carotid termini. Normal MCA and ACA origins. Anterior communicating artery and visualized bilateral ACA branches are within normal limits. The right ACA is within normal limits. Visualized bilateral MCA branches are within normal limits. IMPRESSION: 1. Nonspecific solitary 6 mm T2 and FLAIR hyperintense lesion with a small 2-3 mm focus of central enhancement at the right cingulate gyrus subcortical white matter. No associated diffusion changes to suggest a small acute infarct. No abnormality of the right ACA associated. No significant mass effect. No other acute intracranial abnormality and normal brain signal elsewhere. Top differential Considerations include focal demyelination, infection, non infectious inflammation (e.g. sarcoid), less likely neoplasm. 2. Nonspecific generalized appearing cerebral volume loss for age. Is there an underlying chronic disease state which might explain this? 3. Negative anterior circulation on intracranial MRA. Posterior circulation remarkable for diminutive appearance of the distal right vertebral artery which might be normal anatomic variation. 4. Cervical spine MRI reported separately. Electronically Signed   By: Genevie Ann M.D.   On: 12/28/2015 11:46   Mr Cervical Spine W Wo Contrast  12/28/2015  CLINICAL DATA:  35 year old female with acute onset vertigo, numbness, tingling. Abnormal brain MRI. Initial encounter. EXAM: MRI CERVICAL SPINE WITHOUT AND WITH CONTRAST TECHNIQUE: Multiplanar and multiecho pulse sequences of the cervical spine, to include the craniocervical junction and cervicothoracic junction, were obtained according to standard protocol without and with intravenous contrast. CONTRAST:  76mL MULTIHANCE GADOBENATE DIMEGLUMINE 529 MG/ML IV SOLN in conjunction with contrast enhanced imaging of the brain reported separately. COMPARISON:  Brain MRI and intracranial MRA from today reported separately. FINDINGS: Generalized Thyromegaly, but no discrete thyroid nodule or  mass identified. The entire thyroid is not included on these images. The right vertebral artery appears diminutive throughout the neck, and also appears to have a late entry into the right cervical transverse foramen. Straightening of cervical lordosis. No marrow edema or evidence of acute osseous abnormality. Cervicomedullary junction is within normal limits. Cervical and visualized upper thoracic spinal cord signal is normal. No abnormal intradural enhancement. Minimal disc bulging at C5-C6. Mild left facet hypertrophy at that level. However, no associated stenosis and no cervical spine degeneration elsewhere. Fairly capacious cervical and visualized upper thoracic thecal sac. IMPRESSION: 1. Negative MRI appearance of the cervical spine and spinal cord. No significant cervical spine degeneration. 2. Generalized thyromegaly. The entire thyroid is not included, but no discrete thyroid nodule or mass is identified. Recommend correlation with endocrine labs and thyroid ultrasound follow-up. 3. The right vertebral artery appears diminutive throughout its course, suggesting normal anatomic variation. Results of this exam plus the MRI/MRA of the brain reported separately (please see that report)) were discussed by telephone with Neurology Provider DAVID SMITH on 12/28/2015 at 1156 hours. Electronically Signed   By: Genevie Ann M.D.   On: 12/28/2015 11:59   Mr Virgel Paling (cerebral Arteries)  12/28/2015  CLINICAL DATA:  35 year old female with headache, vertigo, left face and arm numbness. Initial encounter. EXAM: MRI HEAD WITHOUT AND WITH CONTRAST MRA HEAD WITHOUT CONTRAST TECHNIQUE: Multiplanar, multiecho pulse sequences of the brain and surrounding structures were obtained without  and with intravenous contrast. Angiographic images of the head were obtained using MRA technique without contrast. CONTRAST:  38mL MULTIHANCE GADOBENATE DIMEGLUMINE 529 MG/ML IV SOLN COMPARISON:  None. FINDINGS: MRI HEAD FINDINGS No restricted  diffusion to suggest acute infarction. No midline shift, mass effect, evidence of mass lesion, ventriculomegaly, extra-axial collection or acute intracranial hemorrhage. Cervicomedullary junction and pituitary are within normal limits. Negative visualized cervical spine. Major intracranial vascular flow voids are within normal limits. Cerebral volume seems diminished for age diffusely. No cortical encephalomalacia. There is nonspecific T2 and FLAIR hyperintensity in the right cingulate gyrus and/or subcortical white matter of that gyrus (series 8, image 16 and series 11, image 8) with an associated 3 mm focus of abnormal enhancement there. No diffusion changes are evident. No regional mass effect. No other abnormal gray or white matter signal identified. No other abnormal intracranial enhancement identified. No dural thickening. No cortical encephalomalacia or chronic cerebral blood products. Visible internal auditory structures appear normal. Mastoids are clear. Trace paranasal sinus mucosal thickening. Orbit and scalp soft tissues appear normal. Visualized bone marrow signal is within normal limits. MRA HEAD FINDINGS Very diminutive distal right vertebral artery, but with preserved flow signal to the vertebrobasilar junction. The distal left vertebral artery appears normal. Normal AICA origins. Normal basilar artery with fetal type bilateral PCA origins. Normal SCA origins. Bilateral PCA branches are within normal limits. Antegrade flow in both ICA siphons. No siphon stenosis. Normal ophthalmic and posterior communicating artery origins. Normal carotid termini. Normal MCA and ACA origins. Anterior communicating artery and visualized bilateral ACA branches are within normal limits. The right ACA is within normal limits. Visualized bilateral MCA branches are within normal limits. IMPRESSION: 1. Nonspecific solitary 6 mm T2 and FLAIR hyperintense lesion with a small 2-3 mm focus of central enhancement at the right  cingulate gyrus subcortical white matter. No associated diffusion changes to suggest a small acute infarct. No abnormality of the right ACA associated. No significant mass effect. No other acute intracranial abnormality and normal brain signal elsewhere. Top differential Considerations include focal demyelination, infection, non infectious inflammation (e.g. sarcoid), less likely neoplasm. 2. Nonspecific generalized appearing cerebral volume loss for age. Is there an underlying chronic disease state which might explain this? 3. Negative anterior circulation on intracranial MRA. Posterior circulation remarkable for diminutive appearance of the distal right vertebral artery which might be normal anatomic variation. 4. Cervical spine MRI reported separately. Electronically Signed   By: Genevie Ann M.D.   On: 12/28/2015 11:46   I have personally reviewed and evaluated these images and lab results as part of my medical decision-making.   EKG Interpretation None     Consult: Dr. Silverio Decamp of neurology has been consult. After review of MRI, neurology requested LP in the emergency department for further diagnostic testing including MS and other atypical etiologies of MRI abnormality. Consult: Internal medicine teaching service for admission. MDM   Final diagnoses:  Neurologic abnormality  White matter abnormality on MRI of brain   Patient presents with a week's worth of left-sided facial symptoms including numbness and pain. She also identified some intermittent left arm sensory abnormality. Neurologic examination is intact to motor testing. MRI identified a focal lesion. Neurology has evaluated the patient and requests lumbar puncture for further diagnostic evaluation including MS. The patient is nontoxic and alert and does not show signs of acute bacterial meningitis. She also does not have signs of viral meningitis. Her mental status has remained clear throughout her stay in the emergency department.  Patient  tolerated LP well.    Charlesetta Shanks, MD 12/28/15 [phone]

## 2015-12-28 NOTE — Consult Note (Signed)
NEURO HOSPITALIST CONSULT NOTE   Requestig physician: ED MD   Reason for Consult: left facial and arm pain for 1 week  HPI:                                                                                                                                          Lisa [name] is an 35 y.o. female with complaints of left face numbness that was on and off for about one week. This progressed to left facial pain and left arm pain. The pain started in the back of her head and then would wrap around to the front of her left face. She denies any neck pain. She then developed left arm pain that starts at the acromial process and goes down her whole arm.  Where is associated numbness. This also is not constant but on and off.   Past Medical History  Diagnosis Date  . Hypertension     Past Surgical History  Procedure Laterality Date  . Mandible fracture surgery      Family History  Problem Relation Age of Onset  . Hypertension Mother   . Hypertension Father   . Hyperlipidemia Father   . Hyperlipidemia Mother     Social History:  reports that she has never smoked. She does not have any smokeless tobacco history on file. She reports that she drinks alcohol. She reports that she does not use illicit drugs.  No Known Allergies  MEDICATIONS:                                                                                                                     Current Facility-Administered Medications  Medication Dose Route Frequency Provider Last Rate Last Dose  . 0.9 %  sodium chloride infusion   Intravenous Continuous Charlesetta Shanks, MD 125 mL/hr at 12/28/15 0905     Current Outpatient Prescriptions  Medication Sig Dispense Refill  . doxycycline (VIBRAMYCIN) 100 MG capsule Take 1 capsule (100 mg total) by mouth 2 (two) times daily. 28 capsule 0  . HYDROcodone-acetaminophen (NORCO/VICODIN) 5-325 MG tablet Take 1 tablet by mouth every 6 (six) hours as needed. 6 tablet 0  .  ibuprofen (ADVIL,MOTRIN) 800 MG tablet Take 1 tablet (800 mg total) by mouth 3 (three) times daily.  21 tablet 0  . metroNIDAZOLE (FLAGYL) 500 MG tablet Take 1 tablet (500 mg total) by mouth 2 (two) times daily. 28 tablet 0  . promethazine (PHENERGAN) 25 MG tablet Take 1 tablet (25 mg total) by mouth every 6 (six) hours as needed for nausea or vomiting. 8 tablet 0  . traMADol (ULTRAM) 50 MG tablet Take 1 tablet (50 mg total) by mouth every 6 (six) hours as needed. 15 tablet 0      ROS:                                                                                                                                       History obtained from the patient  General ROS: negative for - chills, fatigue, fever, night sweats, weight gain or weight loss Psychological ROS: negative for - behavioral disorder, hallucinations, memory difficulties, mood swings or suicidal ideation Ophthalmic ROS: negative for - blurry vision, double vision, eye pain or loss of vision ENT ROS: negative for - epistaxis, nasal discharge, oral lesions, sore throat, tinnitus or vertigo Allergy and Immunology ROS: negative for - hives or itchy/watery eyes Hematological and Lymphatic ROS: negative for - bleeding problems, bruising or swollen lymph nodes Endocrine ROS: negative for - galactorrhea, hair pattern changes, polydipsia/polyuria or temperature intolerance Respiratory ROS: negative for - cough, hemoptysis, shortness of breath or wheezing Cardiovascular ROS: negative for - chest pain, dyspnea on exertion, edema or irregular heartbeat Gastrointestinal ROS: negative for - abdominal pain, diarrhea, hematemesis, nausea/vomiting or stool incontinence Genito-Urinary ROS: negative for - dysuria, hematuria, incontinence or urinary frequency/urgency Musculoskeletal ROS: negative for - joint swelling or muscular weakness Neurological ROS: as noted in HPI Dermatological ROS: negative for rash and skin lesion changes   Blood pressure  139/102, pulse 75, temperature 98.7 F (37.1 C), temperature source Oral, resp. rate 18, height 5\' 5"  (1.651 m), weight 64.411 kg (142 lb), last menstrual period 12/05/2015, SpO2 100 %.   Neurologic Examination:                                                                                                      HEENT-  Normocephalic, no lesions, without obvious abnormality.  Normal external eye and conjunctiva.  Normal TM's bilaterally.  Normal auditory canals and external ears. Normal external nose, mucus membranes and septum.  Normal pharynx. Cardiovascular- S1, S2 normal, pulses palpable throughout   Lungs- no tachypnea, retractions or cyanosis Abdomen- normal findings: bowel sounds normal Extremities- no edema Lymph-no adenopathy palpable Musculoskeletal-no  joint tenderness, deformity or swelling Skin-warm and dry, no hyperpigmentation, vitiligo, or suspicious lesions  Neurological Examination Mental Status: Alert, oriented, thought content appropriate.  Speech fluent without evidence of aphasia.  Able to follow 3 step commands without difficulty. Cranial Nerves: II:  Visual fields grossly normal, pupils equal, round, reactive to light and accommodation III,IV, VI: ptosis not present, extra-ocular motions intact bilaterally V,VII: smile symmetric, facial light touch sensation decreased on the left side splitting midline to both vibration and LT VIII: hearing normal bilaterally IX,X: uvula rises symmetrically XI: bilateral shoulder shrug XII: midline tongue extension Motor: Right : Upper extremity   5/5    Left:     Upper extremity   5/5  Lower extremity   5/5     Lower extremity   5/5 Tone and bulk:normal tone throughout; no atrophy noted Sensory: Pinprick and light touch intact throughout, bilaterally Deep Tendon Reflexes: 2+ and symmetric throughout Plantars: Right: downgoing   Left: downgoing Cerebellar: normal finger-to-nose and normal heel-to-shin test Gait: not tested       Lab Results: Basic Metabolic Panel:  Recent Labs Lab 12/28/15 0739  NA 139  K 3.4*  CL 105  CO2 22  GLUCOSE 114*  BUN 9  CREATININE 0.87  CALCIUM 9.4    Liver Function Tests: No results for input(s): AST, ALT, ALKPHOS, BILITOT, PROT, ALBUMIN in the last 168 hours. No results for input(s): LIPASE, AMYLASE in the last 168 hours. No results for input(s): AMMONIA in the last 168 hours.  CBC:  Recent Labs Lab 12/28/15 0739  WBC 5.7  HGB 11.4*  HCT 35.9*  MCV 82.2  PLT 350    Cardiac Enzymes: No results for input(s): CKTOTAL, CKMB, CKMBINDEX, TROPONINI in the last 168 hours.  Lipid Panel: No results for input(s): CHOL, TRIG, HDL, CHOLHDL, VLDL, LDLCALC in the last 168 hours.  CBG: No results for input(s): GLUCAP in the last 168 hours.  Microbiology: Results for orders placed or performed during the hospital encounter of 08/11/15  Wet prep, genital     Status: Abnormal   Collection Time: 08/11/15  3:04 PM  Result Value Ref Range Status   Yeast Wet Prep HPF POC NONE SEEN NONE SEEN Final   Trich, Wet Prep NONE SEEN NONE SEEN Final   Clue Cells Wet Prep HPF POC FEW (A) NONE SEEN Final   WBC, Wet Prep HPF POC FEW (A) NONE SEEN Final    Coagulation Studies:  Recent Labs  12/28/15 0739  LABPROT 14.7  INR 1.13    Imaging: No results found.     Assessment and plan per attending neurologist  Etta Quill PA-C [facility] [phone]  12/28/2015, 10:17 AM   Assessment/Plan: 35 YO female with complaints of intermittent left face and arm decreased sensation and pain for one week. Exam is only focal for decreased sensation on the left side but has functional component of splitting midline to tuning fork and light touch.   Recommend: 1) MRI brain and cervical spine with and without contrast to evaluate for CVA or other demyelinating process as she is very young.  If MRI brain and cervical spine normal no further in patient neurology work  up warrented. Will need out patient neurology follow up. If MRI positive for stroke have hospitalist admit and continue with stroke work up.

## 2015-12-29 DIAGNOSIS — R29818 Other symptoms and signs involving the nervous system: Secondary | ICD-10-CM | POA: Diagnosis not present

## 2015-12-29 DIAGNOSIS — G379 Demyelinating disease of central nervous system, unspecified: Secondary | ICD-10-CM | POA: Insufficient documentation

## 2015-12-29 DIAGNOSIS — R200 Anesthesia of skin: Secondary | ICD-10-CM | POA: Insufficient documentation

## 2015-12-29 DIAGNOSIS — R5381 Other malaise: Secondary | ICD-10-CM | POA: Diagnosis not present

## 2015-12-29 DIAGNOSIS — R930 Abnormal findings on diagnostic imaging of skull and head, not elsewhere classified: Secondary | ICD-10-CM | POA: Diagnosis not present

## 2015-12-29 DIAGNOSIS — R9082 White matter disease, unspecified: Secondary | ICD-10-CM | POA: Insufficient documentation

## 2015-12-29 DIAGNOSIS — R202 Paresthesia of skin: Secondary | ICD-10-CM

## 2015-12-29 LAB — CSF IGG: IgG, CSF: 2.2 mg/dL (ref 0.0–8.6)

## 2015-12-29 LAB — BASIC METABOLIC PANEL
Anion gap: 11 (ref 5–15)
BUN: 9 mg/dL (ref 6–20)
CHLORIDE: 108 mmol/L (ref 101–111)
CO2: 21 mmol/L — ABNORMAL LOW (ref 22–32)
Calcium: 9.6 mg/dL (ref 8.9–10.3)
Creatinine, Ser: 0.65 mg/dL (ref 0.44–1.00)
Glucose, Bld: 156 mg/dL — ABNORMAL HIGH (ref 65–99)
POTASSIUM: 4.3 mmol/L (ref 3.5–5.1)
SODIUM: 140 mmol/L (ref 135–145)

## 2015-12-29 LAB — CBC
HEMATOCRIT: 33.4 % — AB (ref 36.0–46.0)
Hemoglobin: 11 g/dL — ABNORMAL LOW (ref 12.0–15.0)
MCH: 27.1 pg (ref 26.0–34.0)
MCHC: 32.9 g/dL (ref 30.0–36.0)
MCV: 82.3 fL (ref 78.0–100.0)
PLATELETS: 337 10*3/uL (ref 150–400)
RBC: 4.06 MIL/uL (ref 3.87–5.11)
RDW: 14.9 % (ref 11.5–15.5)
WBC: 7.4 10*3/uL (ref 4.0–10.5)

## 2015-12-29 LAB — VDRL, CSF: SYPHILIS VDRL QUANT CSF: NONREACTIVE

## 2015-12-29 LAB — HIV ANTIBODY (ROUTINE TESTING W REFLEX): HIV SCREEN 4TH GENERATION: NONREACTIVE

## 2015-12-29 MED ORDER — TRAMADOL HCL 50 MG PO TABS
50.0000 mg | ORAL_TABLET | Freq: Once | ORAL | Status: AC
  Administered 2015-12-29: 50 mg via ORAL
  Filled 2015-12-29: qty 1

## 2015-12-29 MED ORDER — ONDANSETRON HCL 4 MG PO TABS
4.0000 mg | ORAL_TABLET | Freq: Three times a day (TID) | ORAL | Status: DC | PRN
  Administered 2015-12-29 – 2016-01-02 (×3): 4 mg via ORAL
  Filled 2015-12-29 (×3): qty 1

## 2015-12-29 MED ORDER — TRAMADOL HCL 50 MG PO TABS
50.0000 mg | ORAL_TABLET | Freq: Two times a day (BID) | ORAL | Status: AC
  Administered 2015-12-29 – 2015-12-31 (×4): 50 mg via ORAL
  Filled 2015-12-29 (×4): qty 1

## 2015-12-29 MED ORDER — PANTOPRAZOLE SODIUM 40 MG PO TBEC
40.0000 mg | DELAYED_RELEASE_TABLET | Freq: Every day | ORAL | Status: DC
  Administered 2015-12-30 – 2016-01-02 (×4): 40 mg via ORAL
  Filled 2015-12-29 (×4): qty 1

## 2015-12-29 NOTE — Progress Notes (Signed)
Subjective: Feels her left face and harm have improved. Has a slight HA on the right frontal and temporal side but is relieved with tylenol.   Objective: Current vital signs: BP 127/95 mmHg  Pulse 76  Temp(Src) 98.3 F (36.8 C) (Oral)  Resp 20  Ht 5\' 5"  (1.651 m)  Wt 64.411 kg (142 lb)  BMI 23.63 kg/m2  SpO2 100%  LMP 12/05/2015 Vital signs in last 24 hours: Temp:  [98.3 F (36.8 C)-99.6 F (37.6 C)] 98.3 F (36.8 C) (02/16 0924) Pulse Rate:  [72-97] 76 (02/16 0924) Resp:  [18-20] 20 (02/16 0924) BP: (113-151)/(78-95) 127/95 mmHg (02/16 0924) SpO2:  [99 %-100 %] 100 % (02/16 0924)  Intake/Output from previous day: 02/15 0701 - 02/16 0700 In: 960 [P.O.:960] Out: -  Intake/Output this shift:   Nutritional status: Diet regular Room service appropriate?: Yes; Fluid consistency:: Thin  Neurologic Exam: General: NAD Mental Status: Alert, oriented, thought content appropriate.  Speech fluent without evidence of aphasia.  Able to follow 3 step commands without difficulty. Cranial Nerves: II:  Visual fields grossly normal, pupils equal, round, reactive to light and accommodation III,IV, VI: ptosis not present, extra-ocular motions intact bilaterally V,VII: smile symmetric, facial light touch sensation normal bilaterally VIII: hearing normal bilaterally IX,X: uvula rises symmetrically XI: bilateral shoulder shrug XII: midline tongue extension without atrophy or fasciculations  Motor: Right : Upper extremity   5/5    Left:     Upper extremity   5/5  Lower extremity   5/5     Lower extremity   5/5 Tone and bulk:normal tone throughout; no atrophy noted Sensory: Pinprick and light touch intact throughout, bilaterally Deep Tendon Reflexes:  Right: Upper Extremity   Left: Upper extremity   biceps (C-5 to C-6) 2/4   biceps (C-5 to C-6) 2/4 tricep (C7) 2/4    triceps (C7) 2/4 Brachioradialis (C6) 2/4  Brachioradialis (C6) 2/4  Lower Extremity Lower Extremity  quadriceps (L-2  to L-4) 2/4   quadriceps (L-2 to L-4) 2/4 Achilles (S1) 2/4   Achilles (S1) 2/4  Plantars: Right: downgoing   Left: downgoing     Lab Results: Basic Metabolic Panel:  Recent Labs Lab 12/28/15 0739 12/28/15 1520 12/29/15 0712  NA 139  --  140  K 3.4*  --  4.3  CL 105  --  108  CO2 22  --  21*  GLUCOSE 114*  --  156*  BUN 9  --  9  CREATININE 0.87 0.69 0.65  CALCIUM 9.4  --  9.6    Liver Function Tests: No results for input(s): AST, ALT, ALKPHOS, BILITOT, PROT, ALBUMIN in the last 168 hours. No results for input(s): LIPASE, AMYLASE in the last 168 hours. No results for input(s): AMMONIA in the last 168 hours.  CBC:  Recent Labs Lab 12/28/15 0739 12/28/15 1520 12/29/15 0712  WBC 5.7 4.9 7.4  HGB 11.4* 10.4* 11.0*  HCT 35.9* 30.9* 33.4*  MCV 82.2 81.5 82.3  PLT 350 281 337    Cardiac Enzymes: No results for input(s): CKTOTAL, CKMB, CKMBINDEX, TROPONINI in the last 168 hours.  Lipid Panel: No results for input(s): CHOL, TRIG, HDL, CHOLHDL, VLDL, LDLCALC in the last 168 hours.  CBG: No results for input(s): GLUCAP in the last 168 hours.  Microbiology: Results for orders placed or performed during the hospital encounter of 12/28/15  CSF culture with Stat gram stain     Status: None (Preliminary result)   Collection Time: 12/28/15  2:00 PM  Result Value Ref Range Status   Specimen Description CSF  Final   Special Requests NONE  Final   Gram Stain   Final    WBC PRESENT, PREDOMINANTLY MONONUCLEAR NO ORGANISMS SEEN CYTOSPIN SMEAR    Culture NO GROWTH < 24 HOURS  Final   Report Status PENDING  Incomplete    Coagulation Studies:  Recent Labs  12/28/15 0739  LABPROT 14.7  INR 1.13    Imaging: Mr Jeri Cos [id number] Contrast  12/28/2015  CLINICAL DATA:  35 year old female with headache, vertigo, left face and arm numbness. Initial encounter. EXAM: MRI HEAD WITHOUT AND WITH CONTRAST MRA HEAD WITHOUT CONTRAST TECHNIQUE: Multiplanar, multiecho pulse sequences  of the brain and surrounding structures were obtained without and with intravenous contrast. Angiographic images of the head were obtained using MRA technique without contrast. CONTRAST:  17mL MULTIHANCE GADOBENATE DIMEGLUMINE 529 MG/ML IV SOLN COMPARISON:  None. FINDINGS: MRI HEAD FINDINGS No restricted diffusion to suggest acute infarction. No midline shift, mass effect, evidence of mass lesion, ventriculomegaly, extra-axial collection or acute intracranial hemorrhage. Cervicomedullary junction and pituitary are within normal limits. Negative visualized cervical spine. Major intracranial vascular flow voids are within normal limits. Cerebral volume seems diminished for age diffusely. No cortical encephalomalacia. There is nonspecific T2 and FLAIR hyperintensity in the right cingulate gyrus and/or subcortical white matter of that gyrus (series 8, image 16 and series 11, image 8) with an associated 3 mm focus of abnormal enhancement there. No diffusion changes are evident. No regional mass effect. No other abnormal gray or white matter signal identified. No other abnormal intracranial enhancement identified. No dural thickening. No cortical encephalomalacia or chronic cerebral blood products. Visible internal auditory structures appear normal. Mastoids are clear. Trace paranasal sinus mucosal thickening. Orbit and scalp soft tissues appear normal. Visualized bone marrow signal is within normal limits. MRA HEAD FINDINGS Very diminutive distal right vertebral artery, but with preserved flow signal to the vertebrobasilar junction. The distal left vertebral artery appears normal. Normal AICA origins. Normal basilar artery with fetal type bilateral PCA origins. Normal SCA origins. Bilateral PCA branches are within normal limits. Antegrade flow in both ICA siphons. No siphon stenosis. Normal ophthalmic and posterior communicating artery origins. Normal carotid termini. Normal MCA and ACA origins. Anterior communicating  artery and visualized bilateral ACA branches are within normal limits. The right ACA is within normal limits. Visualized bilateral MCA branches are within normal limits. IMPRESSION: 1. Nonspecific solitary 6 mm T2 and FLAIR hyperintense lesion with a small 2-3 mm focus of central enhancement at the right cingulate gyrus subcortical white matter. No associated diffusion changes to suggest a small acute infarct. No abnormality of the right ACA associated. No significant mass effect. No other acute intracranial abnormality and normal brain signal elsewhere. Top differential Considerations include focal demyelination, infection, non infectious inflammation (e.g. sarcoid), less likely neoplasm. 2. Nonspecific generalized appearing cerebral volume loss for age. Is there an underlying chronic disease state which might explain this? 3. Negative anterior circulation on intracranial MRA. Posterior circulation remarkable for diminutive appearance of the distal right vertebral artery which might be normal anatomic variation. 4. Cervical spine MRI reported separately. Electronically Signed   By: Genevie Ann M.D.   On: 12/28/2015 11:46   Mr Cervical Spine W Wo Contrast  12/28/2015  CLINICAL DATA:  35 year old female with acute onset vertigo, numbness, tingling. Abnormal brain MRI. Initial encounter. EXAM: MRI CERVICAL SPINE WITHOUT AND WITH CONTRAST TECHNIQUE: Multiplanar and multiecho pulse sequences of the cervical spine, to include  the craniocervical junction and cervicothoracic junction, were obtained according to standard protocol without and with intravenous contrast. CONTRAST:  8mL MULTIHANCE GADOBENATE DIMEGLUMINE 529 MG/ML IV SOLN in conjunction with contrast enhanced imaging of the brain reported separately. COMPARISON:  Brain MRI and intracranial MRA from today reported separately. FINDINGS: Generalized Thyromegaly, but no discrete thyroid nodule or mass identified. The entire thyroid is not included on these images.  The right vertebral artery appears diminutive throughout the neck, and also appears to have a late entry into the right cervical transverse foramen. Straightening of cervical lordosis. No marrow edema or evidence of acute osseous abnormality. Cervicomedullary junction is within normal limits. Cervical and visualized upper thoracic spinal cord signal is normal. No abnormal intradural enhancement. Minimal disc bulging at C5-C6. Mild left facet hypertrophy at that level. However, no associated stenosis and no cervical spine degeneration elsewhere. Fairly capacious cervical and visualized upper thoracic thecal sac. IMPRESSION: 1. Negative MRI appearance of the cervical spine and spinal cord. No significant cervical spine degeneration. 2. Generalized thyromegaly. The entire thyroid is not included, but no discrete thyroid nodule or mass is identified. Recommend correlation with endocrine labs and thyroid ultrasound follow-up. 3. The right vertebral artery appears diminutive throughout its course, suggesting normal anatomic variation. Results of this exam plus the MRI/MRA of the brain reported separately (please see that report)) were discussed by telephone with Neurology Provider Bridie Colquhoun on 12/28/2015 at 1156 hours. Electronically Signed   By: Genevie Ann M.D.   On: 12/28/2015 11:59   Mr Virgel Paling (cerebral Arteries)  12/28/2015  CLINICAL DATA:  35 year old female with headache, vertigo, left face and arm numbness. Initial encounter. EXAM: MRI HEAD WITHOUT AND WITH CONTRAST MRA HEAD WITHOUT CONTRAST TECHNIQUE: Multiplanar, multiecho pulse sequences of the brain and surrounding structures were obtained without and with intravenous contrast. Angiographic images of the head were obtained using MRA technique without contrast. CONTRAST:  24mL MULTIHANCE GADOBENATE DIMEGLUMINE 529 MG/ML IV SOLN COMPARISON:  None. FINDINGS: MRI HEAD FINDINGS No restricted diffusion to suggest acute infarction. No midline shift, mass effect,  evidence of mass lesion, ventriculomegaly, extra-axial collection or acute intracranial hemorrhage. Cervicomedullary junction and pituitary are within normal limits. Negative visualized cervical spine. Major intracranial vascular flow voids are within normal limits. Cerebral volume seems diminished for age diffusely. No cortical encephalomalacia. There is nonspecific T2 and FLAIR hyperintensity in the right cingulate gyrus and/or subcortical white matter of that gyrus (series 8, image 16 and series 11, image 8) with an associated 3 mm focus of abnormal enhancement there. No diffusion changes are evident. No regional mass effect. No other abnormal gray or white matter signal identified. No other abnormal intracranial enhancement identified. No dural thickening. No cortical encephalomalacia or chronic cerebral blood products. Visible internal auditory structures appear normal. Mastoids are clear. Trace paranasal sinus mucosal thickening. Orbit and scalp soft tissues appear normal. Visualized bone marrow signal is within normal limits. MRA HEAD FINDINGS Very diminutive distal right vertebral artery, but with preserved flow signal to the vertebrobasilar junction. The distal left vertebral artery appears normal. Normal AICA origins. Normal basilar artery with fetal type bilateral PCA origins. Normal SCA origins. Bilateral PCA branches are within normal limits. Antegrade flow in both ICA siphons. No siphon stenosis. Normal ophthalmic and posterior communicating artery origins. Normal carotid termini. Normal MCA and ACA origins. Anterior communicating artery and visualized bilateral ACA branches are within normal limits. The right ACA is within normal limits. Visualized bilateral MCA branches are within normal limits. IMPRESSION: 1. Nonspecific  solitary 6 mm T2 and FLAIR hyperintense lesion with a small 2-3 mm focus of central enhancement at the right cingulate gyrus subcortical white matter. No associated diffusion  changes to suggest a small acute infarct. No abnormality of the right ACA associated. No significant mass effect. No other acute intracranial abnormality and normal brain signal elsewhere. Top differential Considerations include focal demyelination, infection, non infectious inflammation (e.g. sarcoid), less likely neoplasm. 2. Nonspecific generalized appearing cerebral volume loss for age. Is there an underlying chronic disease state which might explain this? 3. Negative anterior circulation on intracranial MRA. Posterior circulation remarkable for diminutive appearance of the distal right vertebral artery which might be normal anatomic variation. 4. Cervical spine MRI reported separately. Electronically Signed   By: Genevie Ann M.D.   On: 12/28/2015 11:46    Medications:  Scheduled: . enoxaparin (LOVENOX) injection  40 mg Subcutaneous Q24H  . methylPREDNISolone (SOLU-MEDROL) injection  250 mg Intravenous Q6H  . 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 Awaiting CSF oligoclonal bands. Improving symptoms on steroids. Has had 1/3 days worth thus far. Will continue to follow. Will need neurology F/U with Dr Alysia Penna as out patient as he is MS specialist. Primary team to make that appointment.        Etta Quill PA-C [facility] [phone]  12/29/2015, 9:51 AM

## 2015-12-29 NOTE — Progress Notes (Signed)
Family Medicine Teaching Service Daily Progress Note Intern Pager: [phone]  Patient name: USHA BROOKENS Medical record number: [id number] Date of birth: 1981/06/30 Age: 35 y.o. Gender: female  Primary Care Provider: Frederico Hamman, MD Consultants: neuro Code Status: FULL  Pt Overview and Major Events to Date:  2/15: admit to FPTS  Assessment and Plan: MAKHARI DESPIRITO is a 35 y.o. female presenting with left facial numbness/weakness . PMH is significant for HTN  Left facial numbness/weakness: Currently not experiencing any facial pain but still has numbness around CN V2/V3 distribution. Differentials include Multiple Sclerosis vs complex migraine vs Trigeminal neuralgia vs early shingles. CSF cells unremarkable. CSF gram stain: wbc present predominantly mononuclear, no organisms; culture pending, CSF cryptococccal antigen neg. HIV neg - Neuro recs: possibly MS  - IV steroids q 6hrs; neuro recommends 5 days of IV steroids    - Will need out patient neurology follow up. - Continue to monitor symptoms - Tylenol for pain if it arises again - s/p lumbar puncture. Follow-up CSF labs: CSF IgG, oligoclonal band, VDRL  Hypokalemia: 3.2 on admission (s/p kdur). wnl today  - s/c Kdur  Anemia: appears at baseline, currently at 10.4. Normocytic. No previous work up. Possibly secondary to menarche - follow-up outpatient - may benefit from iron studies  Thyromegaly seen on MRI cervical spine: MRI cervical spine showing generalized thyromegaly. Pt does admit to recent heat intolerance. No previous thyroid studies in Epic or hx of thyroid abnormality. Physical exam does not show obvious thyromegaly or nodules - TSH: 0.356  FEN/GI: SLIV, regular diet Prophylaxis: Lovenox  Disposition: remain inpatient   Subjective:  Left facial pain and weakness resolved. Feels slightly nauseated this morning, but better with zofran. Has a frontal HA since yesterday evening. Not similar to  migraines that she has had. No visual changes.   Objective: Temp:  [98.6 F (37 C)-99.6 F (37.6 C)] 98.7 F (37.1 C) (02/16 0532) Pulse Rate:  [72-97] 77 (02/16 0532) Resp:  [18-20] 20 (02/16 0532) BP: (113-151)/(78-105) 126/90 mmHg (02/16 0532) SpO2:  [99 %-100 %] 100 % (02/16 0532) Weight:  [64.411 kg (142 lb)] 64.411 kg (142 lb) (02/15 0741) Physical Exam: General: NAD Cardiovascular: RRR, no m/r/g Respiratory: CTAB, normal effort Abdomen: Soft, NT, ND Extremities: no LE edema Neuro: no CN deficits noted, 5/5 strength in upper and lower extremities,   Laboratory:  Recent Labs Lab 12/28/15 0739 12/28/15 1520  WBC 5.7 4.9  HGB 11.4* 10.4*  HCT 35.9* 30.9*  PLT 350 281    Recent Labs Lab 12/28/15 0739 12/28/15 1520  NA 139  --   K 3.4*  --   CL 105  --   CO2 22  --   BUN 9  --   CREATININE 0.87 0.69  CALCIUM 9.4  --   GLUCOSE 114*  --    Imaging/Diagnostic Tests: -  MRI head showed no CVA but did show "Nonspecific solitary 6 mm T2 and FLAIR hyperintense lesion with a small 2-3 mm focus of central enhancement at the right cingulate gyrus subcortical white matter as well as nonspecific generalized appearing cerebral volume loss for age." - MRA: negative anterior circulation on intracranial MRA. Posterior circulation remarkable for diminutive appearance of distal R veteral artery which might be normal anatomic variation  - MR C-spine: generalized thyromegaly, no nodule or mass   Smiley Houseman, MD 12/29/2015, 6:31 AM PGY-1, [place] Intern pager: [phone], text pages welcome

## 2015-12-29 NOTE — Care Management Note (Signed)
Case Management Note  Patient Details  Name: Lisa [name] MRN: [id number] Date of Birth: September 19, 1981  Subjective/Objective:                    Action/Plan: Patient was admitted with facial pain, weakness and numbness. Will follow for discharge needs.  Expected Discharge Date:                  Expected Discharge Plan:     In-House Referral:     Discharge planning Services     Post Acute Care Choice:    Choice offered to:     DME Arranged:    DME Agency:     HH Arranged:    HH Agency:     Status of Service:  In process, will continue to follow  Medicare Important Message Given:    Date Medicare IM Given:    Medicare IM give by:    Date Additional Medicare IM Given:    Additional Medicare Important Message give by:     If discussed at [place] of Stay Meetings, dates discussed:    Additional CommentsRolm Baptise, RN 12/29/2015, 11:12 AM [phone]

## 2015-12-30 DIAGNOSIS — G379 Demyelinating disease of central nervous system, unspecified: Principal | ICD-10-CM

## 2015-12-30 DIAGNOSIS — R29818 Other symptoms and signs involving the nervous system: Secondary | ICD-10-CM | POA: Diagnosis not present

## 2015-12-30 MED ORDER — METHYLPREDNISOLONE SODIUM SUCC 1000 MG IJ SOLR
500.0000 mg | Freq: Two times a day (BID) | INTRAMUSCULAR | Status: AC
  Administered 2015-12-30 (×2): 500 mg via INTRAVENOUS
  Filled 2015-12-30 (×2): qty 4

## 2015-12-30 NOTE — Progress Notes (Signed)
Subjective: No complaints  Objective: Current vital signs: BP 129/90 mmHg  Pulse 78  Temp(Src) 98.2 F (36.8 C) (Oral)  Resp 20  Ht 5\' 5"  (1.651 m)  Wt 64.411 kg (142 lb)  BMI 23.63 kg/m2  SpO2 97%  LMP 12/05/2015 Vital signs in last 24 hours: Temp:  [97.8 F (36.6 C)-98.5 F (36.9 C)] 98.2 F (36.8 C) (02/17 0604) Pulse Rate:  [76-94] 78 (02/17 0604) Resp:  [20] 20 (02/17 0604) BP: (127-138)/(83-95) 129/90 mmHg (02/17 0604) SpO2:  [94 %-100 %] 97 % (02/17 0604)  Intake/Output from previous day: 02/16 0701 - 02/17 0700 In: 120 [P.O.:120] Out: -  Intake/Output this shift:   Nutritional status: Diet regular Room service appropriate?: Yes; Fluid consistency:: Thin  Neurologic Exam: General: NAD Mental Status: Alert, oriented, thought content appropriate.  Speech fluent without evidence of aphasia.  Able to follow 3 step commands without difficulty. Cranial Nerves: II:  Visual fields grossly normal, pupils equal, round, reactive to light and accommodation III,IV, VI: ptosis not present, extra-ocular motions intact bilaterally V,VII: smile symmetric, facial light touch sensation normal bilaterally VIII: hearing normal bilaterally IX,X: uvula rises symmetrically XI: bilateral shoulder shrug XII: midline tongue extension without atrophy or fasciculations  Motor: Right : Upper extremity   5/5    Left:     Upper extremity   5/5  Lower extremity   5/5     Lower extremity   5/5 Tone and bulk:normal tone throughout; no atrophy noted Sensory: Pinprick and light touch intact throughout, bilaterally Deep Tendon Reflexes:  Right: Upper Extremity   Left: Upper extremity   biceps (C-5 to C-6) 2/4   biceps (C-5 to C-6) 2/4 tricep (C7) 2/4    triceps (C7) 2/4 Brachioradialis (C6) 2/4  Brachioradialis (C6) 2/4  Lower Extremity Lower Extremity  quadriceps (L-2 to L-4) 2/4   quadriceps (L-2 to L-4) 2/4 Achilles (S1) 2/4   Achilles (S1) 2/4  Plantars: Right: downgoing   Left:  downgoing     Lab Results: Basic Metabolic Panel:  Recent Labs Lab 12/28/15 0739 12/28/15 1520 12/29/15 0712  NA 139  --  140  K 3.4*  --  4.3  CL 105  --  108  CO2 22  --  21*  GLUCOSE 114*  --  156*  BUN 9  --  9  CREATININE 0.87 0.69 0.65  CALCIUM 9.4  --  9.6    Liver Function Tests: No results for input(s): AST, ALT, ALKPHOS, BILITOT, PROT, ALBUMIN in the last 168 hours. No results for input(s): LIPASE, AMYLASE in the last 168 hours. No results for input(s): AMMONIA in the last 168 hours.  CBC:  Recent Labs Lab 12/28/15 0739 12/28/15 1520 12/29/15 0712  WBC 5.7 4.9 7.4  HGB 11.4* 10.4* 11.0*  HCT 35.9* 30.9* 33.4*  MCV 82.2 81.5 82.3  PLT 350 281 337    Cardiac Enzymes: No results for input(s): CKTOTAL, CKMB, CKMBINDEX, TROPONINI in the last 168 hours.  Lipid Panel: No results for input(s): CHOL, TRIG, HDL, CHOLHDL, VLDL, LDLCALC in the last 168 hours.  CBG: No results for input(s): GLUCAP in the last 168 hours.  Microbiology: Results for orders placed or performed during the hospital encounter of 12/28/15  CSF culture with Stat gram stain     Status: None (Preliminary result)   Collection Time: 12/28/15  2:00 PM  Result Value Ref Range Status   Specimen Description CSF  Final   Special Requests NONE  Final   Gram Stain  Final    WBC PRESENT, PREDOMINANTLY MONONUCLEAR NO ORGANISMS SEEN CYTOSPIN SMEAR    Culture NO GROWTH < 24 HOURS  Final   Report Status PENDING  Incomplete    Coagulation Studies:  Recent Labs  12/28/15 0739  LABPROT 14.7  INR 1.13    Imaging: Mr Jeri Cos [id number] Contrast  12/28/2015  CLINICAL DATA:  35 year old female with headache, vertigo, left face and arm numbness. Initial encounter. EXAM: MRI HEAD WITHOUT AND WITH CONTRAST MRA HEAD WITHOUT CONTRAST TECHNIQUE: Multiplanar, multiecho pulse sequences of the brain and surrounding structures were obtained without and with intravenous contrast. Angiographic images of the  head were obtained using MRA technique without contrast. CONTRAST:  41mL MULTIHANCE GADOBENATE DIMEGLUMINE 529 MG/ML IV SOLN COMPARISON:  None. FINDINGS: MRI HEAD FINDINGS No restricted diffusion to suggest acute infarction. No midline shift, mass effect, evidence of mass lesion, ventriculomegaly, extra-axial collection or acute intracranial hemorrhage. Cervicomedullary junction and pituitary are within normal limits. Negative visualized cervical spine. Major intracranial vascular flow voids are within normal limits. Cerebral volume seems diminished for age diffusely. No cortical encephalomalacia. There is nonspecific T2 and FLAIR hyperintensity in the right cingulate gyrus and/or subcortical white matter of that gyrus (series 8, image 16 and series 11, image 8) with an associated 3 mm focus of abnormal enhancement there. No diffusion changes are evident. No regional mass effect. No other abnormal gray or white matter signal identified. No other abnormal intracranial enhancement identified. No dural thickening. No cortical encephalomalacia or chronic cerebral blood products. Visible internal auditory structures appear normal. Mastoids are clear. Trace paranasal sinus mucosal thickening. Orbit and scalp soft tissues appear normal. Visualized bone marrow signal is within normal limits. MRA HEAD FINDINGS Very diminutive distal right vertebral artery, but with preserved flow signal to the vertebrobasilar junction. The distal left vertebral artery appears normal. Normal AICA origins. Normal basilar artery with fetal type bilateral PCA origins. Normal SCA origins. Bilateral PCA branches are within normal limits. Antegrade flow in both ICA siphons. No siphon stenosis. Normal ophthalmic and posterior communicating artery origins. Normal carotid termini. Normal MCA and ACA origins. Anterior communicating artery and visualized bilateral ACA branches are within normal limits. The right ACA is within normal limits. Visualized  bilateral MCA branches are within normal limits. IMPRESSION: 1. Nonspecific solitary 6 mm T2 and FLAIR hyperintense lesion with a small 2-3 mm focus of central enhancement at the right cingulate gyrus subcortical white matter. No associated diffusion changes to suggest a small acute infarct. No abnormality of the right ACA associated. No significant mass effect. No other acute intracranial abnormality and normal brain signal elsewhere. Top differential Considerations include focal demyelination, infection, non infectious inflammation (e.g. sarcoid), less likely neoplasm. 2. Nonspecific generalized appearing cerebral volume loss for age. Is there an underlying chronic disease state which might explain this? 3. Negative anterior circulation on intracranial MRA. Posterior circulation remarkable for diminutive appearance of the distal right vertebral artery which might be normal anatomic variation. 4. Cervical spine MRI reported separately. Electronically Signed   By: Genevie Ann M.D.   On: 12/28/2015 11:46   Mr Cervical Spine W Wo Contrast  12/28/2015  CLINICAL DATA:  35 year old female with acute onset vertigo, numbness, tingling. Abnormal brain MRI. Initial encounter. EXAM: MRI CERVICAL SPINE WITHOUT AND WITH CONTRAST TECHNIQUE: Multiplanar and multiecho pulse sequences of the cervical spine, to include the craniocervical junction and cervicothoracic junction, were obtained according to standard protocol without and with intravenous contrast. CONTRAST:  87mL MULTIHANCE GADOBENATE DIMEGLUMINE 529 MG/ML  IV SOLN in conjunction with contrast enhanced imaging of the brain reported separately. COMPARISON:  Brain MRI and intracranial MRA from today reported separately. FINDINGS: Generalized Thyromegaly, but no discrete thyroid nodule or mass identified. The entire thyroid is not included on these images. The right vertebral artery appears diminutive throughout the neck, and also appears to have a late entry into the right  cervical transverse foramen. Straightening of cervical lordosis. No marrow edema or evidence of acute osseous abnormality. Cervicomedullary junction is within normal limits. Cervical and visualized upper thoracic spinal cord signal is normal. No abnormal intradural enhancement. Minimal disc bulging at C5-C6. Mild left facet hypertrophy at that level. However, no associated stenosis and no cervical spine degeneration elsewhere. Fairly capacious cervical and visualized upper thoracic thecal sac. IMPRESSION: 1. Negative MRI appearance of the cervical spine and spinal cord. No significant cervical spine degeneration. 2. Generalized thyromegaly. The entire thyroid is not included, but no discrete thyroid nodule or mass is identified. Recommend correlation with endocrine labs and thyroid ultrasound follow-up. 3. The right vertebral artery appears diminutive throughout its course, suggesting normal anatomic variation. Results of this exam plus the MRI/MRA of the brain reported separately (please see that report)) were discussed by telephone with Neurology Provider Trenell Concannon on 12/28/2015 at 1156 hours. Electronically Signed   By: Genevie Ann M.D.   On: 12/28/2015 11:59   Mr Virgel Paling (cerebral Arteries)  12/28/2015  CLINICAL DATA:  35 year old female with headache, vertigo, left face and arm numbness. Initial encounter. EXAM: MRI HEAD WITHOUT AND WITH CONTRAST MRA HEAD WITHOUT CONTRAST TECHNIQUE: Multiplanar, multiecho pulse sequences of the brain and surrounding structures were obtained without and with intravenous contrast. Angiographic images of the head were obtained using MRA technique without contrast. CONTRAST:  65mL MULTIHANCE GADOBENATE DIMEGLUMINE 529 MG/ML IV SOLN COMPARISON:  None. FINDINGS: MRI HEAD FINDINGS No restricted diffusion to suggest acute infarction. No midline shift, mass effect, evidence of mass lesion, ventriculomegaly, extra-axial collection or acute intracranial hemorrhage. Cervicomedullary  junction and pituitary are within normal limits. Negative visualized cervical spine. Major intracranial vascular flow voids are within normal limits. Cerebral volume seems diminished for age diffusely. No cortical encephalomalacia. There is nonspecific T2 and FLAIR hyperintensity in the right cingulate gyrus and/or subcortical white matter of that gyrus (series 8, image 16 and series 11, image 8) with an associated 3 mm focus of abnormal enhancement there. No diffusion changes are evident. No regional mass effect. No other abnormal gray or white matter signal identified. No other abnormal intracranial enhancement identified. No dural thickening. No cortical encephalomalacia or chronic cerebral blood products. Visible internal auditory structures appear normal. Mastoids are clear. Trace paranasal sinus mucosal thickening. Orbit and scalp soft tissues appear normal. Visualized bone marrow signal is within normal limits. MRA HEAD FINDINGS Very diminutive distal right vertebral artery, but with preserved flow signal to the vertebrobasilar junction. The distal left vertebral artery appears normal. Normal AICA origins. Normal basilar artery with fetal type bilateral PCA origins. Normal SCA origins. Bilateral PCA branches are within normal limits. Antegrade flow in both ICA siphons. No siphon stenosis. Normal ophthalmic and posterior communicating artery origins. Normal carotid termini. Normal MCA and ACA origins. Anterior communicating artery and visualized bilateral ACA branches are within normal limits. The right ACA is within normal limits. Visualized bilateral MCA branches are within normal limits. IMPRESSION: 1. Nonspecific solitary 6 mm T2 and FLAIR hyperintense lesion with a small 2-3 mm focus of central enhancement at the right cingulate gyrus subcortical white matter.  No associated diffusion changes to suggest a small acute infarct. No abnormality of the right ACA associated. No significant mass effect. No other  acute intracranial abnormality and normal brain signal elsewhere. Top differential Considerations include focal demyelination, infection, non infectious inflammation (e.g. sarcoid), less likely neoplasm. 2. Nonspecific generalized appearing cerebral volume loss for age. Is there an underlying chronic disease state which might explain this? 3. Negative anterior circulation on intracranial MRA. Posterior circulation remarkable for diminutive appearance of the distal right vertebral artery which might be normal anatomic variation. 4. Cervical spine MRI reported separately. Electronically Signed   By: Genevie Ann M.D.   On: 12/28/2015 11:46    Medications:  Scheduled: . enoxaparin (LOVENOX) injection  40 mg Subcutaneous Q24H  . methylPREDNISolone (SOLU-MEDROL) injection  250 mg Intravenous Q6H  . pantoprazole  40 mg Oral Daily  . 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 Awaiting CSF oligoclonal bands. Improving symptoms on steroids. Has had 2/3 days worth thus far. Will continue to follow. Will need neurology F/U with Dr Alysia Penna as out patient as he is MS specialist. Primary team to make that appointment.   --If can get home health to give Methylprednisolone at home may be discharged from neurology stand point       Etta Quill PA-C [facility] [phone]  12/30/2015, 8:47 AM

## 2015-12-30 NOTE — Progress Notes (Signed)
Family Medicine Teaching Service Daily Progress Note Intern Pager: [phone]  Patient name: Lisa [name] Medical record number: [id number] Date of birth: May 21, 1981 Age: 35 y.o. Gender: female  Primary Care Provider: Frederico Hamman, MD Consultants: neuro Code Status: FULL  Pt Overview and Major Events to Date:  2/15: admit to [facility]PTS  Assessment and Plan: Lisa [name] is a 35 y.o. female presenting with left facial numbness/weakness . 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 CSF cells unremarkable. CSF gram stain: wbc present predominantly mononuclear, no organisms; culture NG x 2 days, CSF cryptococccal antigen neg. HIV neg, CSF IgG 2.2 (wnl). CSF VDRL nonreactive  - Neuro recs:  - IV steroids q 6hrs; neuro recommends 5 days of IV steroids (3/5 day today) - Will need out patient neurology follow up. - Continue to monitor symptoms - Tylenol for pain if it arises again - s/p lumbar puncture. Follow-up CSF labs:  oligoclonal band - discussed with case management regarding completing IV steroids at home. Home health is not available for patient, however case management is working on short stay  Hypokalemia, resolved: 3.2 on admission (s/p kdur). wnl today  - s/c Kdur  Anemia: appears at baseline, currently at 10.4. Normocytic. No previous work up. Possibly secondary to menarche - follow-up outpatient - may benefit from iron studies  Thyromegaly seen on MRI cervical spine: MRI cervical spine showing generalized thyromegaly.  - TSH: 0.356 normal  HTN: stable, no home meds  FEN/GI: SLIV, regular diet Prophylaxis: Lovenox  Disposition: will determine if patient will be able to have steroid infusion via short stay (will discuss with care management)  Subjective:  Doing well. No concerns today.   Objective: Temp:  [97.8 F (36.6 C)-98.5 F (36.9 C)] 98.2  F (36.8 C) (02/17 0604) Pulse Rate:  [76-94] 78 (02/17 0604) Resp:  [20] 20 (02/17 0604) BP: (127-138)/(83-95) 129/90 mmHg (02/17 0604) SpO2:  [94 %-100 %] 97 % (02/17 0604) Physical Exam: General: NAD Cardiovascular: RRR, no m/r/g Respiratory: CTAB, normal effort Abdomen: Soft, NT, ND Extremities: no LE edema Neuro: no CN deficits noted, 5/5 strength in upper and lower extremities, sensation to touch normal in UE and LE bilaterally   Laboratory:  Recent Labs Lab 12/28/15 0739 12/28/15 1520 12/29/15 0712  WBC 5.7 4.9 7.4  HGB 11.4* 10.4* 11.0*  HCT 35.9* 30.9* 33.4*  PLT 350 281 337    Recent Labs Lab 12/28/15 0739 12/28/15 1520 12/29/15 0712  NA 139  --  140  K 3.4*  --  4.3  CL 105  --  108  CO2 22  --  21*  BUN 9  --  9  CREATININE 0.87 0.69 0.65  CALCIUM 9.4  --  9.6  GLUCOSE 114*  --  156*    Imaging/Diagnostic Tests: - MRI head showed no CVA but did show "Nonspecific solitary 6 mm T2 and FLAIR hyperintense lesion with a small 2-3 mm focus of central enhancement at the right cingulate gyrus subcortical white matter as well as nonspecific generalized appearing cerebral volume loss for age." - MRA: negative anterior circulation on intracranial MRA. Posterior circulation remarkable for diminutive appearance of distal R veteral artery which might be normal anatomic variation  - MR C-spine: generalized thyromegaly, no nodule or mass   Smiley Houseman, MD 12/30/2015, 6:49 AM PGY-1, [place] Intern pager: [phone], text pages welcome\

## 2015-12-31 DIAGNOSIS — G379 Demyelinating disease of central nervous system, unspecified: Secondary | ICD-10-CM | POA: Diagnosis not present

## 2015-12-31 DIAGNOSIS — R202 Paresthesia of skin: Secondary | ICD-10-CM | POA: Diagnosis not present

## 2015-12-31 MED ORDER — ALPRAZOLAM 0.5 MG PO TABS: 0.5000 mg | ORAL_TABLET | Freq: Once | ORAL | Status: DC

## 2015-12-31 MED ORDER — METHOCARBAMOL 750 MG PO TABS: 750.0000 mg | ORAL_TABLET | Freq: Three times a day (TID) | ORAL | Status: DC | PRN

## 2015-12-31 MED ORDER — SODIUM CHLORIDE 0.9 % IV SOLN
500.0000 mg | Freq: Two times a day (BID) | INTRAVENOUS | Status: DC
  Administered 2015-12-31 – 2016-01-02 (×4): 500 mg via INTRAVENOUS
  Filled 2015-12-31 (×5): qty 4

## 2015-12-31 NOTE — Progress Notes (Signed)
Interval History:                                                                                                                      Lisa [name] is an 35 y.o. female patient who presented  with acute onset of left facial numbness, and numbness and paresthesias, feeling of weakness in the left arm. Brain MRI was abnormal with a solitary demyelinating lesion in the right medial frontal cortex with small enhancement within the lesion. 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  she had lumbar puncture done with CSF labs sent for MS eval.  She is on high dose of IV Solu-Medrol 250 mg every 6 hours, with resolution of the numbness sx. No new neuro sx.   Past Medical History: Past Medical History  Diagnosis Date  . Hypertension   . Migraine without aura     Past Surgical History  Procedure Laterality Date  . Mandible fracture surgery    . Cesarean section      Family History: Family History  Problem Relation Age of Onset  . Hypertension Mother   . Hypertension Father   . Hyperlipidemia Father   . Hyperlipidemia Mother   . Migraines Mother     Social History:   reports that she has never smoked. She does not have any smokeless tobacco history on file. She reports that she drinks about 1.2 oz of alcohol per week. She reports that she does not use illicit drugs.  Allergies:  No Known Allergies   Medications:                                                                                                                         Current facility-administered medications:  .  acetaminophen (TYLENOL) tablet 650 mg, 650 mg, Oral, Q6H PRN, [name], MD, 650 mg at 12/30/15 1823 .  enoxaparin (LOVENOX) injection 40 mg, 40 mg, Subcutaneous, Q24H, [name], MD, 40 mg at 12/30/15 2322 .  methylPREDNISolone sodium succinate (SOLU-MEDROL) 500 mg in  sodium chloride 0.9 % 50 mL IVPB, 500 mg, Intravenous, Q12H, [name], MD, 500 mg at 12/31/15 1529 .  ondansetron (ZOFRAN) tablet 4 mg, 4 mg, Oral, Q8H PRN, [name], MD, 4 mg at 12/29/15 0721 .  pantoprazole (PROTONIX) EC tablet 40 mg, 40 mg, Oral, Daily, [name], RPH,  40 mg at 12/31/15 1017   Neurologic Examination:                                                                                                      Blood pressure 140/94, pulse 70, temperature 98.5 F (36.9 C), temperature source Oral, resp. rate 16, height 5\' 5"  (1.651 m), weight 64.411 kg (142 lb), last menstrual period 12/05/2015, SpO2 99 %.  Evaluation of higher integrative functions including: Level of alertness: Alert,  Oriented to time, place and person Attention span and concentration  - intact   Speech: fluent, no evidence of dysarthria or aphasia noted.  Test the following cranial nerves: 2-12 grossly intact Motor examination: Normal tone, bulk, full 5/5 motor strength in all 4 extremities Examination of sensation : Normal and symmetric sensation to pinprick in all 4 extremities and on face Examination of deep tendon reflexes: 2+, normal and symmetric in all extremities, normal plantars bilaterally Test coordination: Normal finger nose testing, with no evidence of limb appendicular ataxia or abnormal involuntary movements or tremors noted.  Gait: Deferred   Lab Results: Basic Metabolic Panel:  Recent Labs Lab 12/28/15 0739 12/28/15 1520 12/29/15 0712  NA 139  --  140  K 3.4*  --  4.3  CL 105  --  108  CO2 22  --  21*  GLUCOSE 114*  --  156*  BUN 9  --  9  CREATININE 0.87 0.69 0.65  CALCIUM 9.4  --  9.6    Liver Function Tests: No results for input(s): AST, ALT, ALKPHOS, BILITOT, PROT, ALBUMIN in the last 168 hours. No results for input(s): LIPASE, AMYLASE in the last 168 hours. No results for input(s): AMMONIA in the last 168 hours.  CBC:  Recent Labs Lab  12/28/15 0739 12/28/15 1520 12/29/15 0712  WBC 5.7 4.9 7.4  HGB 11.4* 10.4* 11.0*  HCT 35.9* 30.9* 33.4*  MCV 82.2 81.5 82.3  PLT 350 281 337    Cardiac Enzymes: No results for input(s): CKTOTAL, CKMB, CKMBINDEX, TROPONINI in the last 168 hours.  Lipid Panel: No results for input(s): CHOL, TRIG, HDL, CHOLHDL, VLDL, LDLCALC in the last 168 hours.  CBG: No results for input(s): GLUCAP in the last 168 hours.  Microbiology: Results for orders placed or performed during the hospital encounter of 12/28/15  CSF culture with Stat gram stain     Status: None (Preliminary result)   Collection Time: 12/28/15  2:00 PM  Result Value Ref Range Status   Specimen Description CSF  Final   Special Requests NONE  Final   Gram Stain   Final    WBC PRESENT, PREDOMINANTLY MONONUCLEAR NO ORGANISMS SEEN CYTOSPIN SMEAR    Culture NO GROWTH 3 DAYS  Final   Report Status PENDING  Incomplete    Imaging: Mr [name] Contrast  12/28/2015  CLINICAL DATA:  35 year old female with headache, vertigo, left face and arm numbness. Initial encounter. EXAM: MRI HEAD WITHOUT AND WITH CONTRAST MRA HEAD WITHOUT CONTRAST TECHNIQUE: Multiplanar, multiecho pulse sequences of the brain and surrounding structures were obtained without  and with intravenous contrast. Angiographic images of the head were obtained using MRA technique without contrast. CONTRAST:  85mL MULTIHANCE GADOBENATE DIMEGLUMINE 529 MG/ML IV SOLN COMPARISON:  None. FINDINGS: MRI HEAD FINDINGS No restricted diffusion to suggest acute infarction. No midline shift, mass effect, evidence of mass lesion, ventriculomegaly, extra-axial collection or acute intracranial hemorrhage. Cervicomedullary junction and pituitary are within normal limits. Negative visualized cervical spine. Major intracranial vascular flow voids are within normal limits. Cerebral volume seems diminished for age diffusely. No cortical encephalomalacia. There is nonspecific T2 and FLAIR  hyperintensity in the right cingulate gyrus and/or subcortical white matter of that gyrus (series 8, image 16 and series 11, image 8) with an associated 3 mm focus of abnormal enhancement there. No diffusion changes are evident. No regional mass effect. No other abnormal gray or white matter signal identified. No other abnormal intracranial enhancement identified. No dural thickening. No cortical encephalomalacia or chronic cerebral blood products. Visible internal auditory structures appear normal. Mastoids are clear. Trace paranasal sinus mucosal thickening. Orbit and scalp soft tissues appear normal. Visualized bone marrow signal is within normal limits. MRA HEAD FINDINGS Very diminutive distal right vertebral artery, but with preserved flow signal to the vertebrobasilar junction. The distal left vertebral artery appears normal. Normal AICA origins. Normal basilar artery with fetal type bilateral PCA origins. Normal SCA origins. Bilateral PCA branches are within normal limits. Antegrade flow in both ICA siphons. No siphon stenosis. Normal ophthalmic and posterior communicating artery origins. Normal carotid termini. Normal MCA and ACA origins. Anterior communicating artery and visualized bilateral ACA branches are within normal limits. The right ACA is within normal limits. Visualized bilateral MCA branches are within normal limits. IMPRESSION: 1. Nonspecific solitary 6 mm T2 and FLAIR hyperintense lesion with a small 2-3 mm focus of central enhancement at the right cingulate gyrus subcortical white matter. No associated diffusion changes to suggest a small acute infarct. No abnormality of the right ACA associated. No significant mass effect. No other acute intracranial abnormality and normal brain signal elsewhere. Top differential Considerations include focal demyelination, infection, non infectious inflammation (e.g. sarcoid), less likely neoplasm. 2. Nonspecific generalized appearing cerebral volume loss for  age. Is there an underlying chronic disease state which might explain this? 3. Negative anterior circulation on intracranial MRA. Posterior circulation remarkable for diminutive appearance of the distal right vertebral artery which might be normal anatomic variation. 4. Cervical spine MRI reported separately. Electronically Signed   By: Genevie Ann M.D.   On: 12/28/2015 11:46   Mr Cervical Spine W Wo Contrast  12/28/2015  CLINICAL DATA:  35 year old female with acute onset vertigo, numbness, tingling. Abnormal brain MRI. Initial encounter. EXAM: MRI CERVICAL SPINE WITHOUT AND WITH CONTRAST TECHNIQUE: Multiplanar and multiecho pulse sequences of the cervical spine, to include the craniocervical junction and cervicothoracic junction, were obtained according to standard protocol without and with intravenous contrast. CONTRAST:  57mL MULTIHANCE GADOBENATE DIMEGLUMINE 529 MG/ML IV SOLN in conjunction with contrast enhanced imaging of the brain reported separately. COMPARISON:  Brain MRI and intracranial MRA from today reported separately. FINDINGS: Generalized Thyromegaly, but no discrete thyroid nodule or mass identified. The entire thyroid is not included on these images. The right vertebral artery appears diminutive throughout the neck, and also appears to have a late entry into the right cervical transverse foramen. Straightening of cervical lordosis. No marrow edema or evidence of acute osseous abnormality. Cervicomedullary junction is within normal limits. Cervical and visualized upper thoracic spinal cord signal is normal. No abnormal intradural enhancement.  Minimal disc bulging at C5-C6. Mild left facet hypertrophy at that level. However, no associated stenosis and no cervical spine degeneration elsewhere. Fairly capacious cervical and visualized upper thoracic thecal sac. IMPRESSION: 1. Negative MRI appearance of the cervical spine and spinal cord. No significant cervical spine degeneration. 2. Generalized  thyromegaly. The entire thyroid is not included, but no discrete thyroid nodule or mass is identified. Recommend correlation with endocrine labs and thyroid ultrasound follow-up. 3. The right vertebral artery appears diminutive throughout its course, suggesting normal anatomic variation. Results of this exam plus the MRI/MRA of the brain reported separately (please see that report)) were discussed by telephone with Neurology Provider DAVID SMITH on 12/28/2015 at 1156 hours. Electronically Signed   By: Genevie Ann M.D.   On: 12/28/2015 11:59   Mr Virgel Paling (cerebral Arteries)  12/28/2015  CLINICAL DATA:  35 year old female with headache, vertigo, left face and arm numbness. Initial encounter. EXAM: MRI HEAD WITHOUT AND WITH CONTRAST MRA HEAD WITHOUT CONTRAST TECHNIQUE: Multiplanar, multiecho pulse sequences of the brain and surrounding structures were obtained without and with intravenous contrast. Angiographic images of the head were obtained using MRA technique without contrast. CONTRAST:  46mL MULTIHANCE GADOBENATE DIMEGLUMINE 529 MG/ML IV SOLN COMPARISON:  None. FINDINGS: MRI HEAD FINDINGS No restricted diffusion to suggest acute infarction. No midline shift, mass effect, evidence of mass lesion, ventriculomegaly, extra-axial collection or acute intracranial hemorrhage. Cervicomedullary junction and pituitary are within normal limits. Negative visualized cervical spine. Major intracranial vascular flow voids are within normal limits. Cerebral volume seems diminished for age diffusely. No cortical encephalomalacia. There is nonspecific T2 and FLAIR hyperintensity in the right cingulate gyrus and/or subcortical white matter of that gyrus (series 8, image 16 and series 11, image 8) with an associated 3 mm focus of abnormal enhancement there. No diffusion changes are evident. No regional mass effect. No other abnormal gray or white matter signal identified. No other abnormal intracranial enhancement identified. No dural  thickening. No cortical encephalomalacia or chronic cerebral blood products. Visible internal auditory structures appear normal. Mastoids are clear. Trace paranasal sinus mucosal thickening. Orbit and scalp soft tissues appear normal. Visualized bone marrow signal is within normal limits. MRA HEAD FINDINGS Very diminutive distal right vertebral artery, but with preserved flow signal to the vertebrobasilar junction. The distal left vertebral artery appears normal. Normal AICA origins. Normal basilar artery with fetal type bilateral PCA origins. Normal SCA origins. Bilateral PCA branches are within normal limits. Antegrade flow in both ICA siphons. No siphon stenosis. Normal ophthalmic and posterior communicating artery origins. Normal carotid termini. Normal MCA and ACA origins. Anterior communicating artery and visualized bilateral ACA branches are within normal limits. The right ACA is within normal limits. Visualized bilateral MCA branches are within normal limits. IMPRESSION: 1. Nonspecific solitary 6 mm T2 and FLAIR hyperintense lesion with a small 2-3 mm focus of central enhancement at the right cingulate gyrus subcortical white matter. No associated diffusion changes to suggest a small acute infarct. No abnormality of the right ACA associated. No significant mass effect. No other acute intracranial abnormality and normal brain signal elsewhere. Top differential Considerations include focal demyelination, infection, non infectious inflammation (e.g. sarcoid), less likely neoplasm. 2. Nonspecific generalized appearing cerebral volume loss for age. Is there an underlying chronic disease state which might explain this? 3. Negative anterior circulation on intracranial MRA. Posterior circulation remarkable for diminutive appearance of the distal right vertebral artery which might be normal anatomic variation. 4. Cervical spine MRI reported separately. Electronically Signed  By: Genevie Ann M.D.   On: 12/28/2015 11:46     Assessment and plan:   Lisa [name] is an 35 y.o. female patient who presented Patient presented with acute onset of left facial numbness, and numbness and paresthesias, feeling of weakness in the left arm. Brain MRI was abnormal with a solitary demyelinating lesion in the right medial frontal cortex with small enhancement within the lesion. 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 she had lumbar puncture done with CSF labs sent for MS eval.  She is tolerating high dose of IV Solu-Medrol 250 mg every 6 hours, with complete resolution of symptoms. No new neuro issues to address otherwise. Complete total 5 days of  Iv solumedrol.   Recommend f/u with a neurologist as out patient for continued monitoring, and f/u of pending CSF results.  Will sign off. Please call if any questions.

## 2015-12-31 NOTE — Progress Notes (Signed)
Educated and discussed concerns voiced by pt about solumedrol regimen and follow-up appointments post discharge. Pt reassured all medications and follow-up instructions will be provided verbally as well as written copy provided for Pt to take home to reference.  Pt and Pt family v/u. All discharge concerns passed onto oncoming RN.

## 2015-12-31 NOTE — Progress Notes (Signed)
Dr Shelah Lewandowsky want the patient to be independent so she can move freely.

## 2015-12-31 NOTE — Progress Notes (Signed)
Family Medicine Teaching Service Daily Progress Note Intern Pager: [phone]  Patient name: Lisa [name] Medical record number: [id number] Date of birth: 08-02-1981 Age: 35 y.o. Gender: female  Primary Care Provider: Frederico Hamman, MD Consultants: neuro Code Status: FULL  Pt Overview and Major Events to Date:  2/15: admit to [place] 2/15: IV steroids started  Assessment and Plan: Lisa [name] is a 35 y.o. female presenting with left facial numbness/weakness . 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 CSF cells unremarkable. CSF gram stain: wbc present predominantly mononuclear, no organisms; culture NG x 2 days, CSF cryptococccal antigen neg. HIV neg, CSF IgG 2.2 (wnl). CSF VDRL nonreactive  - Neuro recs:  - IV steroids q 6hrs; neuro recommends 5 days of IV steroids (4/5 day today) - Will need out patient neurology follow up. - Continue to monitor symptoms - Tylenol for pain if it arises again - s/p lumbar puncture. Follow-up CSF labs:  oligoclonal band - discussed with case management regarding completing IV steroids at home. Home health is not available for patient, however case management is working on short stay  Hypokalemia, resolved: 3.2 on admission (s/p kdur). wnl today  - s/c Kdur  Anemia: appears at baseline, last at 78. Normocytic. No previous work up. Possibly secondary to menarche - follow-up outpatient - may benefit from iron studies  Thyromegaly seen on MRI cervical spine: MRI cervical spine showing generalized thyromegaly. TSH: 0.356 normal - outpatient ultrasound  HTN: stable, no home meds  FEN/GI: SLIV, regular diet Prophylaxis: Lovenox  Disposition: home once completed IV steroids  Subjective:  Doing well. No concerns today. She plans to walk around today   Objective: Temp:  [98 F (36.7 C)-98.4 F (36.9 C)] 98.2 F (36.8 C)  (02/18 0539) Pulse Rate:  [71-89] 71 (02/18 0539) Resp:  [18-19] 18 (02/18 0539) BP: (132-145)/(72-104) 133/96 mmHg (02/18 0539) SpO2:  [99 %-100 %] 100 % (02/18 0539) Physical Exam: General: NAD Cardiovascular: RRR, no m/r/g Respiratory: CTAB, normal effort Abdomen: Soft, NT, ND Extremities: no LE edema Neuro: Alert, oriented, face symmetrical   Laboratory:  Recent Labs Lab 12/28/15 0739 12/28/15 1520 12/29/15 0712  WBC 5.7 4.9 7.4  HGB 11.4* 10.4* 11.0*  HCT 35.9* 30.9* 33.4*  PLT 350 281 337    Recent Labs Lab 12/28/15 0739 12/28/15 1520 12/29/15 0712  NA 139  --  140  K 3.4*  --  4.3  CL 105  --  108  CO2 22  --  21*  BUN 9  --  9  CREATININE 0.87 0.69 0.65  CALCIUM 9.4  --  9.6  GLUCOSE 114*  --  156*    Imaging/Diagnostic Tests: - MRI head showed no CVA but did show "Nonspecific solitary 6 mm T2 and FLAIR hyperintense lesion with a small 2-3 mm focus of central enhancement at the right cingulate gyrus subcortical white matter as well as nonspecific generalized appearing cerebral volume loss for age." - MRA: negative anterior circulation on intracranial MRA. Posterior circulation remarkable for diminutive appearance of distal R veteral artery which might be normal anatomic variation  - MR C-spine: generalized thyromegaly, no nodule or mass   Mariel Aloe, MD 12/31/2015, 7:17 AM PGY-3, [place] Intern pager: [phone], text pages welcome\

## 2016-01-01 DIAGNOSIS — G379 Demyelinating disease of central nervous system, unspecified: Secondary | ICD-10-CM | POA: Diagnosis not present

## 2016-01-01 LAB — CSF CULTURE: CULTURE: NO GROWTH

## 2016-01-01 MED ORDER — HYDRALAZINE HCL 20 MG/ML IJ SOLN: 2.0000 mg | INTRAMUSCULAR | Status: DC | PRN

## 2016-01-01 NOTE — Discharge Summary (Signed)
Family Medicine Teaching [facility] Discharge Summary  Patient name: Lisa [name] Medical record number: [id number] Date of birth: Nov 24, 1980 Age: 35 y.o. 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 HTN Hypokalemia Generalized Thyromegaly (incidental finding) with normal TSH  Disposition: home   Discharge Condition: symptoms resolved  Discharge Exam:  GEN: NAD CV: RRR, no murmurs, rubs, or gallops PULM: CTAB, normal effort ABD: Soft, nontender, nondistended, NABS, no organomegaly SKIN: No rash or cyanosis; warm and well-perfused EXTR: No lower extremity edema or calf tenderness PSYCH: Mood and affect euthymic, normal rate and volume of speech NEURO: Awake, alert, no focal deficits grossly, normal speech  Brief Hospital Course:  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 She did not have associated visual changes. She reported headaches that improved with BC powder. EKG was within normal limits. Neurology consulted. MRI cervical spine was within normal limits, however noted generalized thyromegaly (please see below). MRI brain showed nonspecific solitary  25mm T2 and flair hyperintense lesion with 2-27mm focus of central enhacement at the right cingulate gyrus; nonspecific generalized cerebral volume loss for age. MRA head showed negative anterior circulation. Spinal tap was done: CSF culture showed no growth x 3 days, cell count was within normal limits, CSF IgG was within normal limits (2.2), CSF VDRL was nonreactive, Cryptococcal antigen was  negative. CSF oligoclonal bands were negative. HIV antibody was nonreactive. ESR was within normal limits. Per neurology recommendations, patient completed 5 day course of IV steroids. Patient to be seen as an outpatient with Lisa [name] (neurologist/MS specialist). Patient seen by PT who recommended outpatient PT, rolling walker, and shower stool.   HTN: Blood pressures were stable on admission. Patient is not on medications at home. Blood pressures were slightly elevated after starting IV steroids. Patient was asymptomatic. Medications were not started as this was thought to be due to IV steroids.   Generalized Thyromegaly: noted on MRI C-spine. TSH was normal at 0.356.   Hypokalemia: Patient was noted to have K of 3.2 which improved with Kdur.   Normocytic Anemia:  Hemoglobin was 11.4, which appears at baseline.   Issues for Follow Up:  - generalized thyromegaly: consider [place]  - f/u anemia, no previous workup. May benefit from iron studies - follow up blood pressures  Significant Procedures: none  Significant Labs and Imaging:   Recent Labs Lab 12/28/15 0739 12/28/15 1520 12/29/15 0712  WBC 5.7 4.9 7.4  HGB 11.4* 10.4* 11.0*  HCT 35.9* 30.9* 33.4*  PLT 350 281 337    Recent Labs Lab 12/28/15 0739 12/28/15 1520 12/29/15 0712  NA 139  --  140  K 3.4*  --  4.3  CL 105  --  108  CO2 22  --  21*  GLUCOSE 114*  --  156*  BUN 9  --  9  CREATININE 0.87 0.69 0.65  CALCIUM 9.4  --  9.6   MRI/MRA Head: IMPRESSION: 1. Nonspecific solitary 6 mm T2 and FLAIR hyperintense lesion with [name] small 2-3 mm focus of central enhancement at the right cingulate gyrus subcortical white matter. No associated diffusion changes to suggest [name] small acute infarct. No abnormality of the right  ACA associated. No significant mass effect. No other acute intracranial abnormality and normal brain signal elsewhere. Top differential Considerations include focal demyelination, infection, non infectious  inflammation (e.g. sarcoid), less likely neoplasm. 2. Nonspecific generalized appearing cerebral volume loss for age. Is there an underlying chronic disease state which might explain this? 3. Negative anterior circulation on intracranial MRA. Posterior circulation remarkable for diminutive appearance of the distal right vertebral artery which might be normal anatomic variation. 4. Cervical spine MRI reported separately.  MRI C-spine:  IMPRESSION: 1. Negative MRI appearance of the cervical spine and spinal cord. No significant cervical spine degeneration. 2. Generalized thyromegaly. The entire thyroid is not included, but no discrete thyroid nodule or mass is identified. Recommend correlation with endocrine labs and thyroid ultrasound follow-up. 3. The right vertebral artery appears diminutive throughout its course, suggesting normal anatomic variation.  Results/Tests Pending at Time of Discharge: none  Discharge Medications:    Medication List    ASK your doctor about these medications        ibuprofen 800 MG tablet  Commonly known as:  ADVIL,MOTRIN  Take 1 tablet (800 mg total) by mouth 3 (three) times daily.     multivitamin with minerals Tabs tablet  Take 1 tablet by mouth daily.        Discharge Instructions: Please refer to Patient Instructions section of EMR for full details.  Patient was counseled important signs and symptoms that should prompt return to medical care, changes in medications, dietary instructions, activity restrictions, and follow up appointments.   Follow-Up Appointments:     Follow-up Information    Follow up with Lisa A, MD.   Specialty:  Neurology   Why:  Please make an apointment with Dr. Felecia [name] (neurologist) soon.    Contact information:   [place] [address] [phone]       Lisa Houseman, MD 01/01/2016, 11:31 AM PGY-1, [place]

## 2016-01-01 NOTE — Progress Notes (Signed)
Family Medicine Teaching Service Daily Progress Note Intern Pager: [phone]  Patient name: Lisa [name] Medical record number: [id number] Date of birth: Jun 10, 1981 Age: 35 y.o. Gender: female  Primary Care Provider: Frederico Hamman, MD Consultants: neuro Code Status: FULL  Pt Overview and Major Events to Date:  2/15: admit to [facility] 2/15: IV steroids started  Assessment and Plan: Lisa [name] is a 35 y.o. female presenting with left facial numbness/weakness . 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 CSF cells unremarkable. CSF gram stain: wbc present predominantly mononuclear, no organisms; culture NG x 2 days, CSF cryptococccal antigen neg. HIV neg, CSF IgG 2.2 (wnl). CSF VDRL nonreactive  - Neuro recs:  - IV steroids q 6hrs; neuro recommends 5 days of IV steroids (5/5 day today with last dose at 0100 2/20)  - Will need out patient neurology follow up. - Continue to monitor symptoms - Tylenol for pain if it arises again - s/p lumbar puncture. Follow-up CSF labs: oligoclonal band - PT  Hypokalemia, resolved: 3.2 on admission (s/p kdur). wnl today  - s/c Kdur  Anemia: appears at baseline, last at 35. Normocytic. No previous work up. Possibly secondary to menarche - follow-up outpatient - may benefit from iron studies  Thyromegaly seen on MRI cervical spine: MRI cervical spine showing generalized thyromegaly. TSH: 0.356 normal - outpatient ultrasound  HTN: Slightly elevated over the past 24 hours, likely result of steroids. Asymptomatic.  no home meds - will continue to monitor for now  FEN/GI: SLIV, regular diet Prophylaxis: Lovenox  Disposition: home once completed IV steroids  Subjective:  Doing well no concerns. Has only been walking to the bathroom; has needed help with walking while in the hospital. Denies HA, chest pain, SOB, visual  changes  Objective: Temp:  [98.1 F (36.7 C)-98.5 F (36.9 C)] 98.2 F (36.8 C) (02/19 0610) Pulse Rate:  [57-78] 63 (02/19 0610) Resp:  [16-20] 20 (02/19 0610) BP: (134-149)/(90-110) 149/95 mmHg (02/19 0610) SpO2:  [99 %-100 %] 100 % (02/19 0610) Physical Exam: General: NAD Cardiovascular: RRR, no m/r/g Respiratory: CTAB, normal effort Abdomen: Soft, NT, ND Extremities: no LE edema Neuro: Alert, oriented, face symmetrical; strength 5/5 upper and lower extremities, sensation normal to touch in upper and LE bilaterally.   Laboratory:  Recent Labs Lab 12/28/15 0739 12/28/15 1520 12/29/15 0712  WBC 5.7 4.9 7.4  HGB 11.4* 10.4* 11.0*  HCT 35.9* 30.9* 33.4*  PLT 350 281 337    Recent Labs Lab 12/28/15 0739 12/28/15 1520 12/29/15 0712  NA 139  --  140  K 3.4*  --  4.3  CL 105  --  108  CO2 22  --  21*  BUN 9  --  9  CREATININE 0.87 0.69 0.65  CALCIUM 9.4  --  9.6  GLUCOSE 114*  --  156*    Imaging/Diagnostic Tests: - MRI head showed no CVA but did show "Nonspecific solitary 6 mm T2 and FLAIR hyperintense lesion with a small 2-3 mm focus of central enhancement at the right cingulate gyrus subcortical white matter as well as nonspecific generalized appearing cerebral volume loss for age." - MRA: negative anterior circulation on intracranial MRA. Posterior circulation remarkable for diminutive appearance of distal R veteral artery which might be normal anatomic variation  - MR C-spine: generalized thyromegaly, no nodule or mass   Smiley Houseman, MD 01/01/2016, 6:38 AM PGY-1, [place] Intern pager: [phone], text  pages welcome

## 2016-01-02 DIAGNOSIS — R5381 Other malaise: Secondary | ICD-10-CM

## 2016-01-02 DIAGNOSIS — R29818 Other symptoms and signs involving the nervous system: Secondary | ICD-10-CM | POA: Diagnosis not present

## 2016-01-02 DIAGNOSIS — G379 Demyelinating disease of central nervous system, unspecified: Secondary | ICD-10-CM | POA: Diagnosis not present

## 2016-01-02 DIAGNOSIS — R202 Paresthesia of skin: Secondary | ICD-10-CM | POA: Diagnosis not present

## 2016-01-02 LAB — OLIGOCLONAL BANDS, CSF + SERM

## 2016-01-02 NOTE — Evaluation (Signed)
Physical Therapy Evaluation Patient Details Name: Lisa [name] MRN: [id number] DOB: [birth year] Today's Date: 01/02/2016   History of Present Illness  Pt is a 35 y/o female who presents with L sided facial numbness and weakness. Diagnosis formed around suspected primary CNS demyelinating condition.   Clinical Impression  Pt admitted with above diagnosis. Pt currently with functional limitations due to the deficits listed below (see PT Problem List). At the time of PT eval pt was able to perform transfers and ambulation with close guard or min assist for safety. Pt hesitant to use the RW at first however requires the increased support that the walker provides. Pt overall moving very slowly and is extremely guarded with each step. She states that she is eager to return to work however safety is a concern as she has a very physical job. Pt will benefit from skilled PT to increase their independence and safety with mobility to allow discharge to the venue listed below.       Follow Up Recommendations Outpatient PT;Supervision for mobility/OOB    Equipment Recommendations  Rolling walker with 5" wheels;3in1 (PT)    Recommendations for Other Services       Precautions / Restrictions Precautions Precautions: Fall Restrictions Weight Bearing Restrictions: No      Mobility  Bed Mobility Overal bed mobility: Modified Independent             General bed mobility comments: Pt was able to transition to EOB with no physical assistance and no use of bed rails. Increased time required.   Transfers Overall transfer level: Needs assistance Equipment used: None;Rolling walker (2 wheeled) Transfers: Sit to/from Stand Sit to Stand: Min guard         General transfer comment: Hands-on guarding provided without RW for support. With UE support on walker, supervision was appropriate.   Ambulation/Gait Ambulation/Gait assistance: Min assist;Min guard Ambulation Distance (Feet): 200  Feet Assistive device: Rolling walker (2 wheeled);None;Straight cane Gait Pattern/deviations: Step-through pattern;Decreased stride length;Trunk flexed;Narrow base of support;Antalgic Gait velocity: Decreased Gait velocity interpretation: Below normal speed for age/gender General Gait Details: Pt initially ambulated without any AD and required min assist for balance support. Attempted with SPC and pt had difficulty with sequencing, appeared very guarded and usnteady. With RW, pt appeared more confident, however continued to demonstrate a slow and guarded gait pattern.   Stairs            Wheelchair Mobility    Modified Rankin (Stroke Patients Only)       Balance Overall balance assessment: Needs assistance Sitting-balance support: Feet supported;No upper extremity supported Sitting balance-Leahy Scale: Fair     Standing balance support: No upper extremity supported;During functional activity Standing balance-Leahy Scale: Poor                               Pertinent Vitals/Pain Pain Assessment: 0-10 Pain Score: 5  Pain Descriptors / Indicators: Headache Pain Intervention(s): Monitored during session    Home Living Family/patient expects to be discharged to:: Private residence Living Arrangements: Parent Available Help at Discharge: Family;Available 24 hours/day Type of Home: House Home Access: Stairs to enter Entrance Stairs-Rails: Right;Left;Can reach both Entrance Stairs-Number of Steps: 2 Home Layout: One level Home Equipment: Grab bars - tub/shower      Prior Function Level of Independence: Independent               Hand Dominance   Dominant Hand: Left  Extremity/Trunk Assessment   Upper Extremity Assessment: Defer to OT evaluation           Lower Extremity Assessment: LLE deficits/detail   LLE Deficits / Details: BLE's appear weak with functional mobility, however L weaker than R. Pt reports no sensation differences on L  side, just weakness.   Cervical / Trunk Assessment: Normal  Communication   Communication: No difficulties  Cognition Arousal/Alertness: Awake/alert Behavior During Therapy: WFL for tasks assessed/performed Overall Cognitive Status: Within Functional Limits for tasks assessed                      General Comments      Exercises        Assessment/Plan    PT Assessment Patient needs continued PT services  PT Diagnosis Difficulty walking;Acute pain   PT Problem List Decreased strength;Decreased range of motion;Decreased activity tolerance;Decreased balance;Decreased mobility;Decreased knowledge of use of DME;Decreased safety awareness;Decreased knowledge of precautions  PT Treatment Interventions DME instruction;Gait training;Stair training;Functional mobility training;Therapeutic activities;Therapeutic exercise;Neuromuscular re-education;Patient/family education   PT Goals (Current goals can be found in the Care Plan section) Acute Rehab PT Goals Patient Stated Goal: Return to work. 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 PT Goal Formulation: With patient Time For Goal Achievement: 01/09/16 Potential to Achieve Goals: Good    Frequency Min 3X/week   Barriers to discharge        Co-evaluation               End of Session Equipment Utilized During Treatment: Gait belt Activity Tolerance: Patient limited by fatigue Patient left: in chair;with call bell/phone within reach;with family/visitor present Nurse Communication: Mobility status         Time: 0753-0820 PT Time Calculation (min) (ACUTE ONLY): 27 min   Charges:   PT Evaluation $PT Eval Moderate Complexity: 1 Procedure PT Treatments $Gait Training: 8-22 mins   PT G Codes:        Rolinda Roan 01/08/16, 10:30 AM   Rolinda Roan, PT, DPT Acute Rehabilitation Services Pager: [phone]

## 2016-01-02 NOTE — Care Management Note (Signed)
Case Management Note  Patient Details  Name: Lisa [name] MRN: [id number] Date of Birth: 10/10/1981  Subjective/Objective:                    Action/Plan: Patient discharging home today self care. Patient ordered a 3 in 1 and rolling walker. Jermaine with [facility] [employer] notified and delivered the equipment to the room. Bedside RN updated.   Expected Discharge Date:                  Expected Discharge Plan:  Home/Self Care  In-House Referral:     Discharge planning Services  CM Consult  Post Acute Care Choice:  Durable Medical Equipment Choice offered to:  Patient  DME Arranged:  3-N-1, Walker rolling DME Agency:     HH Arranged:    HH Agency:     Status of Service:  Completed, signed off  Medicare Important Message Given:    Date Medicare IM Given:    Medicare IM give by:    Date Additional Medicare IM Given:    Additional Medicare Important Message give by:     If discussed at [place] of Stay Meetings, dates discussed:    Additional Comments:  Pollie Friar, RN 01/02/2016, 2:09 PM

## 2016-01-02 NOTE — Discharge Instructions (Signed)
Please seek medical care if your symptoms come back and/or worsen. Please make a follow up appointment with the neurologist and your primary care provider.

## 2016-01-02 NOTE — Progress Notes (Signed)
Pt declined PT referral at this time.  States she is unable to pay out of pocket.  No other concerns.

## 2016-01-02 NOTE — Progress Notes (Signed)
Pt discharged at this time with equipment.  She verbalizes no distress or discomfort.  She verbalizes understanding of discharge instructions and follow up appointments.  No prescriptions given.  She has all belongings with her.  Per Family Practice MD she is able to return to work tomorrow 01/03/16.

## 2016-01-09 ENCOUNTER — Other Ambulatory Visit: Payer: Self-pay | Admitting: *Deleted

## 2016-01-09 ENCOUNTER — Ambulatory Visit (INDEPENDENT_AMBULATORY_CARE_PROVIDER_SITE_OTHER): Payer: No Typology Code available for payment source | Admitting: Neurology

## 2016-01-09 ENCOUNTER — Encounter: Payer: Self-pay | Admitting: Neurology

## 2016-01-09 VITALS — BP 112/72 | HR 96 | Resp 16 | Ht 65.0 in | Wt 159.4 lb

## 2016-01-09 DIAGNOSIS — M791 Myalgia: Secondary | ICD-10-CM | POA: Diagnosis not present

## 2016-01-09 DIAGNOSIS — G4489 Other headache syndrome: Secondary | ICD-10-CM | POA: Insufficient documentation

## 2016-01-09 DIAGNOSIS — IMO0001 Reserved for inherently not codable concepts without codable children: Secondary | ICD-10-CM

## 2016-01-09 DIAGNOSIS — M609 Myositis, unspecified: Secondary | ICD-10-CM

## 2016-01-09 DIAGNOSIS — R200 Anesthesia of skin: Secondary | ICD-10-CM

## 2016-01-09 DIAGNOSIS — R930 Abnormal findings on diagnostic imaging of skull and head, not elsewhere classified: Secondary | ICD-10-CM

## 2016-01-09 DIAGNOSIS — F329 Major depressive disorder, single episode, unspecified: Secondary | ICD-10-CM | POA: Insufficient documentation

## 2016-01-09 DIAGNOSIS — R9082 White matter disease, unspecified: Secondary | ICD-10-CM

## 2016-01-09 DIAGNOSIS — H539 Unspecified visual disturbance: Secondary | ICD-10-CM | POA: Insufficient documentation

## 2016-01-09 DIAGNOSIS — F32A Depression, unspecified: Secondary | ICD-10-CM

## 2016-01-09 MED ORDER — GABAPENTIN 300 MG PO CAPS: ORAL_CAPSULE | ORAL | Status: AC

## 2016-01-09 MED ORDER — DULOXETINE HCL 60 MG PO CPEP: 60.0000 mg | ORAL_CAPSULE | Freq: Every day | ORAL | Status: AC

## 2016-01-09 NOTE — Patient Instructions (Signed)
We will check labwork today  Someone will call to schedue the vision test  I will see you back in 6 weeks but call if any new or worsening symptom.

## 2016-01-09 NOTE — Progress Notes (Signed)
[facility]  PATIENT: Lisa [name] DOB: 11/22/1980  REFERRING DOCTOR OR PCP:  PCP is Gracy Racer SOURCE: patient, records in EMR, Labs and imaging reports in EMR, MRI images on PACS  _________________________________   HISTORICAL  CHIEF COMPLAINT:  Chief Complaint  Patient presents with  . Numbness    Sts. around 01-02-16 she began having left sided numbness/weakness.  Sts. vision left eye was decreased--she was seeing spots.  She went to [facility] ER where she sts. mri brain was suspicious for MS.  She was admitted and given 5 days of IV steroids.[place]  . Extremity Weakness  . Decreased Visual Acuity    Without corrective lenses right eye 20/30, left eye 20/40/fim    HISTORY OF PRESENT ILLNESS:  I had the pleasure of seeing your patient, Lisa [name], at [facility] for neurologic consultation regarding her numbness and abnormal MRI.  She also reports some difficulty with visual acuity.  About 2 weeks ago, she was feeling bad in general with aches and then started to experience a headache in the right side, behind the eyes and around the temples and vertex. Then, after while she began to experience some numbness in the left face, arm and leg. She was very uncomfortable with a headache and was unable to sleep.  She had not experienced a headache like this in the past.    Due to all these symptoms, she presented to the emergency room on 12/28/2015 [facility]). At the emergency room she was evaluated for her pain and had an EKG which was normal. She had an MRI of the brain that showed a single focus in the juxtacortical white matter beneath the cingulate gyrus of the frontal lobe on the right. There was some enhancement of this focus. There were no other foci noted within the brain. She also had an MRI of the cervical spine. The spinal cord appeared normal. Over these images. She also had blood work was normal or near normal. A lumbar  puncture showed CSF with a normal IgG index and no oligoclonal bands.  She was admitted and received several days of IV steroids.. She felt that her leg worsened while she was in the hospital and she did some physical therapy due to left leg clumsiness and weakness.     Gait/strnegth/sensation:   She feels her gait is still not baseline. The left leg seems slightly weak. She has a tingling sensation in the left face and arm. In the leg, she has that tingling but also feels it is heavy and she is a little bit uncoordinated in that leg. She denies spasticity.  Bladder:  She denies any change in her bladder. Specifically, she has a baseline low level of urinary frequency.  Vision:   She notes some changes with her vision since the headache began. Specifically, her left eye seems to have spots in the vision.  Fatigue/sleep:   She notes a lot of fatigue since the headache has started. Feels that most nights she has been sleeping well recently.  Mood/cognition: She notes some depression and has had a few crying spells over the past couple of weeks. She also notes some decreased focus and short-term memory.  REVIEW OF SYSTEMS: Constitutional: No fevers, chills, sweats, or change in appetite Eyes: No visual changes, double vision, eye pain Ear, nose and throat: No hearing loss, ear pain, nasal congestion, sore throat Cardiovascular: No chest pain, palpitations Respiratory: No shortness of breath at rest or with exertion.  No wheezes GastrointestinaI: No nausea, vomiting, diarrhea, abdominal pain, fecal incontinence Genitourinary: No dysuria, urinary retention or frequency.  No nocturia. Musculoskeletal: No neck pain, back pain Integumentary: No rash, pruritus, skin lesions Neurological: as above Psychiatric: No depression at this time.  No anxiety Endocrine: No palpitations, diaphoresis, change in appetite, change in weigh or increased thirst Hematologic/Lymphatic: No anemia, purpura,  petechiae. Allergic/Immunologic: No itchy/runny eyes, nasal congestion, recent allergic reactions, rashes  ALLERGIES: No Known Allergies  HOME MEDICATIONS:  Current outpatient prescriptions:  Marland Kitchen  Multiple Vitamin (MULTIVITAMIN WITH MINERALS) TABS tablet, Take 1 tablet by mouth daily., Disp: , Rfl:  .  DULoxetine (CYMBALTA) 60 MG capsule, Take 1 capsule (60 mg total) by mouth daily., Disp: 30 capsule, Rfl: 11 .  gabapentin (NEURONTIN) 300 MG capsule, One po in morning, two po at night, Disp: 90 capsule, Rfl: 11  PAST MEDICAL HISTORY: Past Medical History  Diagnosis Date  . Hypertension   . Migraine without aura     PAST SURGICAL HISTORY: Past Surgical History  Procedure Laterality Date  . Mandible fracture surgery    . Cesarean section      FAMILY HISTORY: Family History  Problem Relation Age of Onset  . Hypertension Mother   . Hypertension Father   . Hyperlipidemia Father   . Hyperlipidemia Mother   . Migraines Mother     SOCIAL HISTORY:  Social History   Social History  . Marital Status: Single    Spouse Name: N/A  . Number of Children: N/A  . Years of Education: N/A   Occupational History  . Not on file.   Social History Main Topics  . Smoking status: Never Smoker   . Smokeless tobacco: Not on file  . Alcohol Use: 1.2 oz/week    2 Standard drinks or equivalent per week     Comment: socially  . Drug Use: No  . Sexual Activity: Not on file   Other Topics Concern  . Not on file   Social History Narrative     PHYSICAL EXAM  Filed Vitals:   01/09/16 0904  BP: 112/72  Pulse: 96  Resp: 16  Height: 5\' 5"  (1.651 m)  Weight: 159 lb 6.4 oz (72.303 kg)    Body mass index is 26.53 kg/(m^2).   General: The patient is well-developed and well-nourished and in no acute distress  Eyes:  Funduscopic exam shows normal optic discs and retinal vessels.  Neck: The neck is supple, no carotid bruits are noted.  The neck is nontender.  Cardiovascular: The  heart has a regular rate and rhythm with a normal S1 and S2. There were no murmurs, gallops or rubs. Lungs are clear to auscultation.  Skin: Extremities are without significant edema.  Musculoskeletal:  Back is nontender  Neurologic Exam  Mental status: The patient is alert and oriented x 3 at the time of the examination. The patient has apparent normal recent and remote memory, with an apparently normal attention span and concentration ability.   Speech is normal.  Cranial nerves: Extraocular movements are full. Pupils are equal, round, and reactive to light and accomodation.  Visual fields are full.  Facial symmetry is present. There is reduced left  facial sensation to soft touch .Facial strength is normal.  Trapezius and sternocleidomastoid strength is normal. No dysarthria is noted.  The tongue is midline, and the patient has symmetric elevation of the soft palate. No obvious hearing deficits are noted.  Motor:  Muscle bulk is normal.   Tone is  normal. Strength is  5 / 5 in all 4 extremities.   Sensory: Sensory testing shows reduced temperature, soft touch and vibration sensation in all 4 extremities.  Coordination: Cerebellar testing reveals good finger-nose-finger and heel-to-shin bilaterally.   Arm rolling was slower on the left.    Gait and station: Station is normal.   Gait is arthritic. Tandem gait is wide. Romberg is negative.   Reflexes: Deep tendon reflexes are symmetric and normal bilaterally.   Plantar responses are flexor.    DIAGNOSTIC DATA (LABS, IMAGING, TESTING) - I reviewed patient records, labs, notes, testing and imaging myself where available.  Lab Results  Component Value Date   WBC 7.4 12/29/2015   HGB 11.0* 12/29/2015   HCT 33.4* 12/29/2015   MCV 82.3 12/29/2015   PLT 337 12/29/2015      Component Value Date/Time   NA 140 12/29/2015 0712   K 4.3 12/29/2015 0712   CL 108 12/29/2015 0712   CO2 21* 12/29/2015 0712   GLUCOSE 156* 12/29/2015 0712   BUN  9 12/29/2015 0712   CREATININE 0.65 12/29/2015 0712   CALCIUM 9.6 12/29/2015 0712   PROT 7.5 08/11/2015 0930   ALBUMIN 3.9 08/11/2015 0930   AST 22 08/11/2015 0930   ALT 9* 08/11/2015 0930   ALKPHOS 54 08/11/2015 0930   BILITOT 0.4 08/11/2015 0930   GFRNONAA >60 12/29/2015 0712   GFRAA >60 12/29/2015 0712   No results found for: CHOL, HDL, LDLCALC, LDLDIRECT, TRIG, CHOLHDL No results found for: HGBA1C No results found for: VITAMINB12 Lab Results  Component Value Date   TSH 0.356 12/28/2015       ASSESSMENT AND PLAN  White matter abnormality on MRI of brain - Plan: ANA w/Reflex, Sedimentation rate, Pan-ANCA, B. burgdorfi antibodies, Angiotensin converting enzyme, CANCELED: Angiotensin converting enzyme, CSF  Other headache syndrome - Plan: ANA w/Reflex, Sedimentation rate, Pan-ANCA, B. burgdorfi antibodies, Angiotensin converting enzyme, CANCELED: Angiotensin converting enzyme, CSF  Numbness - Plan: B. burgdorfi antibodies, CANCELED: Angiotensin converting enzyme, CSF  Myalgia and myositis - Plan: ANA w/Reflex, Sedimentation rate, Pan-ANCA, B. burgdorfi antibodies, Angiotensin converting enzyme, CANCELED: Angiotensin converting enzyme, CSF  Vision disturbance - Plan: Sedimentation rate, Visual evoked potential test, Angiotensin converting enzyme, CANCELED: Angiotensin converting enzyme, CSF  Depression     In summary, Lennon Ugolini is a 35 year old woman had headache and left-sided numbness prompting an emergency room visit. She was found to have an enhancing left frontal subcortical focus. The rest of the brain and the spinal cord appeared normal. Although definitely troubling for multiple sclerosis, a single focus, despite her symptoms, is not enough to make a diagnosis.   CSF was normal.   First, we need to rule out some other possible diagnoses. I'll check vasculitis labs, Lyme and Ace.     We will also check a visual evoked potential test to determine if her mild visual  disturbance is consistent with optic neuritis.    To help with her headache, 60 mg of Toradol will be injected today. Additionally, I would place her on gabapentin for dysesthesias and insomnia and duloxetine for her mood disturbance and her dysesthesias.  I discussed with her that if the blood work does not reveal an alternative diagnosis that I would want to recheck an MRI of the brain in about 4 months to determine if there are any changes that would make the diagnosis of multiple sclerosis more likely. I will see her back in about 6 or 8 weeks for a follow-up.  Richard A. Felecia Shelling, MD, PhD [id number], [id number] PM Certified in Neurology, Clinical Neurophysiology, Sleep Medicine, Pain Medicine and Neuroimaging  [facility] [address] [place], [place] [address] [phone]

## 2016-01-10 LAB — SEDIMENTATION RATE: Sed Rate: 7 mm/hr (ref 0–32)

## 2016-01-10 LAB — ANA W/REFLEX: Anti Nuclear Antibody(ANA): NEGATIVE

## 2016-01-10 LAB — ANGIOTENSIN CONVERTING ENZYME: ANGIO CONVERT ENZYME: 19 U/L (ref 14–82)

## 2016-01-11 LAB — PAN-ANCA
ANCA Proteinase 3: <3.5 U/mL (ref 0.0–3.5)
C-ANCA: <1:20 {titer}
Myeloperoxidase Ab: <9 U/mL (ref 0.0–9.0)

## 2016-01-20 ENCOUNTER — Telehealth: Payer: Self-pay | Admitting: *Deleted

## 2016-01-20 NOTE — Telephone Encounter (Signed)
-----   Message from Britt Bottom, MD sent at 01/20/2016 11:48 AM EST ----- Please let her know that the blood work we did at the last visit looks good.

## 2016-01-20 NOTE — Telephone Encounter (Signed)
LMTC./fim 

## 2016-01-25 ENCOUNTER — Telehealth: Payer: Self-pay | Admitting: *Deleted

## 2016-01-25 NOTE — Telephone Encounter (Signed)
LM with mother for her to call for lab results/fim

## 2016-01-25 NOTE — Telephone Encounter (Signed)
-----   Message from Britt Bottom, MD sent at 01/20/2016 11:48 AM EST ----- Please let her know that the blood work we did at the last visit looks good.

## 2016-02-02 ENCOUNTER — Encounter: Payer: Self-pay | Admitting: Neurology

## 2016-02-20 ENCOUNTER — Ambulatory Visit: Payer: No Typology Code available for payment source | Admitting: Neurology

## 2016-02-21 ENCOUNTER — Encounter: Payer: Self-pay | Admitting: Neurology

## 2016-02-28 ENCOUNTER — Telehealth: Payer: Self-pay | Admitting: Neurology

## 2016-02-28 NOTE — Telephone Encounter (Signed)
Lucia Gaskins called said she received a call that someone in the household had an appt on 03/01/16. Cecelia Byars said this pt does not live with her, she is the patient's daughter foster mother. Pt told this person and social worker she never used [employer] number or address and [employer] got address and phone from a data base. Cecelia Byars said she never authorized the patient to use her address or phone number. This patient's information needs to be updated asap.

## 2016-02-29 ENCOUNTER — Telehealth: Payer: Self-pay | Admitting: *Deleted

## 2016-02-29 ENCOUNTER — Encounter: Payer: Self-pay | Admitting: Neurology

## 2016-02-29 ENCOUNTER — Ambulatory Visit (INDEPENDENT_AMBULATORY_CARE_PROVIDER_SITE_OTHER): Payer: No Typology Code available for payment source | Admitting: Neurology

## 2016-02-29 VITALS — BP 122/90 | HR 74 | Resp 16 | Ht 65.0 in | Wt 162.5 lb

## 2016-02-29 DIAGNOSIS — G4489 Other headache syndrome: Secondary | ICD-10-CM | POA: Diagnosis not present

## 2016-02-29 DIAGNOSIS — F329 Major depressive disorder, single episode, unspecified: Secondary | ICD-10-CM

## 2016-02-29 DIAGNOSIS — R9082 White matter disease, unspecified: Secondary | ICD-10-CM

## 2016-02-29 DIAGNOSIS — R930 Abnormal findings on diagnostic imaging of skull and head, not elsewhere classified: Secondary | ICD-10-CM | POA: Diagnosis not present

## 2016-02-29 DIAGNOSIS — H539 Unspecified visual disturbance: Secondary | ICD-10-CM

## 2016-02-29 DIAGNOSIS — R200 Anesthesia of skin: Secondary | ICD-10-CM | POA: Diagnosis not present

## 2016-02-29 DIAGNOSIS — F32A Depression, unspecified: Secondary | ICD-10-CM

## 2016-02-29 MED ORDER — MELOXICAM 15 MG PO TABS: 15.0000 mg | ORAL_TABLET | Freq: Every day | ORAL | Status: AC

## 2016-02-29 NOTE — Telephone Encounter (Signed)
Pt. has an appt. with RAS this morning and a note has been made to correct contact info/fim

## 2016-02-29 NOTE — Telephone Encounter (Signed)
I have spoken with pt. during ov today and advised that per RAS, labs done in our office at last ov were ok.  She verbalized understanding of same.  She has corrected her contact information today/fim

## 2016-02-29 NOTE — Progress Notes (Signed)
[facility]  PATIENT: Lisa [name] DOB: 14-Feb-1981  REFERRING DOCTOR OR PCP:  PCP is Gracy Racer SOURCE: patient, records in EMR, Labs and imaging reports in EMR, MRI images on PACS  _________________________________   HISTORICAL  CHIEF COMPLAINT:  Chief Complaint  Patient presents with  . Abnormal MRI Brain    Sts. she continues to have left sided weakness and pain, and now also c/o spasms in right hand.  Doesn't think Gabapentin helps much--causes next day drowsiness. Sts. Cymbalta helps but makes her feel funny./fim    HISTORY OF PRESENT ILLNESS:  Lisa [name] is a 35 yo woman with the onset of numbness and pain February 2017 and was found to have an abnormal brain MRI.    Since last seen, she has had more pain.    She feels more tired on the gabapoentin and cymbalta.  Headache/Abnormal MRI History:     In mid February 2017,  she was feeling bad in general with aches and then started to experience a headache in the right side, behind the eyes and around the temples and vertex. Then, after while she began to experience some numbness in the left face, arm and leg. She was very uncomfortable with a headache and was unable to sleep.  She had not experienced a headache like this in the past.  Due to all these symptoms, she presented to the emergency room on 12/28/2015 [facility]). At the emergency room she was evaluated for her pain and had an EKG which was normal. She had an MRI of the brain that showed a single focus in the juxtacortical white matter beneath the cingulate gyrus of the frontal lobe on the right. There was some enhancement of this focus. There were no other foci noted within the brain. She also had an MRI of the cervical spine. The spinal cord appeared normal. Over these images. She also had blood work was normal or near normal. A lumbar puncture showed CSF with a normal IgG index and no oligoclonal bands.     She was admitted and received  several days of IV steroids.. She felt that her leg worsened while she was in the hospital and she did some physical therapy due to left leg clumsiness and weakness.   I saw her 01/09/16 and ordered vasculitis labs which were normal.    Gait/strength/sensation:   She feels her gait is still a little off with a small limp due to left leg seems slightly weak. She has a tingling sensation in the left shoulder and arm. She has pain in her right arm/hand.   She denies spasticity.  Bladder:  She denies any change in her bladder. Specifically, she has a baseline low level of urinary frequency.  Vision:   She notes her left eye seems to have spots in the vision at times.  Fatigue/sleep:   She notes some more fatigue this year.    Feels that most nights she has been sleeping well recently.   Pain sometimes wakes her up.        Mood/cognition: She notes some depression and has had a few crying spells this year.   She feels the Cymbalta is helping her a little bit.   She also notes some decreased focus and short-term memory.  REVIEW OF SYSTEMS: Constitutional: No fevers, chills, sweats, or change in appetite Eyes: No visual changes, double vision, eye pain Ear, nose and throat: No hearing loss, ear pain, nasal congestion, sore throat Cardiovascular: No  chest pain, palpitations Respiratory: No shortness of breath at rest or with exertion.   No wheezes GastrointestinaI: No nausea, vomiting, diarrhea, abdominal pain, fecal incontinence Genitourinary: No dysuria, urinary retention or frequency.  No nocturia. Musculoskeletal: No neck pain, back pain Integumentary: No rash, pruritus, skin lesions Neurological: as above Psychiatric: No depression at this time.  No anxiety Endocrine: No palpitations, diaphoresis, change in appetite, change in weigh or increased thirst Hematologic/Lymphatic: No anemia, purpura, petechiae. Allergic/Immunologic: No itchy/runny eyes, nasal congestion, recent allergic  reactions, rashes  ALLERGIES: No Known Allergies  HOME MEDICATIONS:  Current outpatient prescriptions:  .  DULoxetine (CYMBALTA) 60 MG capsule, Take 1 capsule (60 mg total) by mouth daily., Disp: 30 capsule, Rfl: 11 .  gabapentin (NEURONTIN) 300 MG capsule, One po in morning, two po at night, Disp: 90 capsule, Rfl: 11 .  Multiple Vitamin (MULTIVITAMIN WITH MINERALS) TABS tablet, Take 1 tablet by mouth daily., Disp: , Rfl:   PAST MEDICAL HISTORY: Past Medical History  Diagnosis Date  . Hypertension   . Migraine without aura     PAST SURGICAL HISTORY: Past Surgical History  Procedure Laterality Date  . Mandible fracture surgery    . Cesarean section      FAMILY HISTORY: Family History  Problem Relation Age of Onset  . Hypertension Mother   . Hypertension Father   . Hyperlipidemia Father   . Hyperlipidemia Mother   . Migraines Mother     SOCIAL HISTORY:  Social History   Social History  . Marital Status: Single    Spouse Name: N/A  . Number of Children: N/A  . Years of Education: N/A   Occupational History  . Not on file.   Social History Main Topics  . Smoking status: Never Smoker   . Smokeless tobacco: Not on file  . Alcohol Use: 1.2 oz/week    2 Standard drinks or equivalent per week     Comment: socially  . Drug Use: No  . Sexual Activity: Not on file   Other Topics Concern  . Not on file   Social History Narrative     PHYSICAL EXAM  Filed Vitals:   02/29/16 0944  BP: 122/90  Pulse: 74  Resp: 16  Height: 5\' 5"  (1.651 m)  Weight: 162 lb 8 oz (73.71 kg)    Body mass index is 27.04 kg/(m^2).   General: The patient is well-developed and well-nourished and in no acute distress  Neck: The neck is supple and has good ROM with mild paraspinal tenderness..   Skin: Extremities are without significant rash.  Neurologic Exam  Mental status: The patient is alert and oriented x 3 at the time of the examination. The patient has apparent normal  recent and remote memory, with an apparently normal attention span and concentration ability.   Speech is normal.  Cranial nerves: Extraocular movements are full.   There is reduced left  facial sensation to soft touch .Facial strength is normal.  Trapezius and sternocleidomastoid strength is normal. No dysarthria is noted.  The tongue is midline, and the patient has symmetric elevation of the soft palate. No obvious hearing deficits are noted.  Motor:  Muscle bulk is normal.   Tone is normal. Strength is  5 / 5 in all 4 extremities.   Sensory: Sensory testing shows reduced  touch and symmetric vibration sensation in left arm/leg.  Coordination: Cerebellar testing reveals good finger-nose-finger and heel-to-shin bilaterally.    Gait and station: Station is normal.   Gait is  arthritic. Tandem gait is mildly wide. Romberg is negative.   Reflexes: Deep tendon reflexes are symmetric and normal bilaterally.        DIAGNOSTIC DATA (LABS, IMAGING, TESTING) - I reviewed patient records, labs, notes, testing and imaging myself where available.  Lab Results  Component Value Date   WBC 7.4 12/29/2015   HGB 11.0* 12/29/2015   HCT 33.4* 12/29/2015   MCV 82.3 12/29/2015   PLT 337 12/29/2015      Component Value Date/Time   NA 140 12/29/2015 0712   K 4.3 12/29/2015 0712   CL 108 12/29/2015 0712   CO2 21* 12/29/2015 0712   GLUCOSE 156* 12/29/2015 0712   BUN 9 12/29/2015 0712   CREATININE 0.65 12/29/2015 0712   CALCIUM 9.6 12/29/2015 0712   PROT 7.5 08/11/2015 0930   ALBUMIN 3.9 08/11/2015 0930   AST 22 08/11/2015 0930   ALT 9* 08/11/2015 0930   ALKPHOS 54 08/11/2015 0930   BILITOT 0.4 08/11/2015 0930   GFRNONAA >60 12/29/2015 0712   GFRAA >60 12/29/2015 0712   No results found for: CHOL, HDL, LDLCALC, LDLDIRECT, TRIG, CHOLHDL No results found for: HGBA1C No results found for: VITAMINB12 Lab Results  Component Value Date   TSH 0.356 12/28/2015       ASSESSMENT AND PLAN  No  diagnosis found.   1.  Stop gabapentin as poorly tolerated. 2.  Continue Cymbalta. 3.   meloxicam prn. 4.   We will recheck MRI of the brain in 2 months.  If progression consider further evaluation/traeatment based on appearance.   I will see her back in about  8 weeks for a follow-up, sooner if problems   Gunther Zawadzki A. Felecia Shelling, MD, PhD [id number], [id number] AM Certified in Neurology, Clinical Neurophysiology, Sleep Medicine, Pain Medicine and Neuroimaging  [facility] [address] [place], [place] [address] [phone]

## 2016-02-29 NOTE — Telephone Encounter (Signed)
-----   Message from Britt Bottom, MD sent at 01/20/2016 11:48 AM EST ----- Please let her know that the blood work we did at the last visit looks good.

## 2016-05-04 ENCOUNTER — Ambulatory Visit: Payer: No Typology Code available for payment source | Admitting: Neurology

## 2016-05-08 ENCOUNTER — Telehealth: Payer: Self-pay | Admitting: *Deleted

## 2016-05-08 NOTE — Telephone Encounter (Signed)
LMOM.  She noshowed appt. with RAS  yesterday.  He has reviewed her chart--since she had abnormal mri in the past, he did want to repeat mri brain with and without contrast, then have her f/u with him to discuss results. Will see if she if agreeable with this, and order mri if she is/fim

## 2016-10-12 IMAGING — MR MR CERVICAL SPINE WO/W CM
12 of 21 series · 25 of 48 positions shown · IV contrast (Yes   MULTIHANCE)
Comparison: Brain MRI and intracranial MRA from today reported
separately.

CLINICAL DATA: 34-year-old female with acute onset vertigo,
numbness, tingling. Abnormal brain MRI. Initial encounter.

EXAM:
MRI CERVICAL SPINE WITHOUT AND WITH CONTRAST
TECHNIQUE: Multiplanar and multiecho pulse sequences of the cervical spine, to
include the craniocervical junction and cervicothoracic junction,
were obtained according to standard protocol without and with
intravenous contrast.
CONTRAST:  13mL MULTIHANCE GADOBENATE DIMEGLUMINE 529 MG/ML IV SOLN
in conjunction with contrast enhanced imaging of the brain reported
separately.

[Series 3: DWI · axial · 3.0mm · 1.09mm/px · z∈[-51,+66]mm · 4 of 86 slices shown]
[im 1/86]
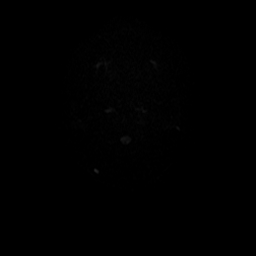
[im 29/86]
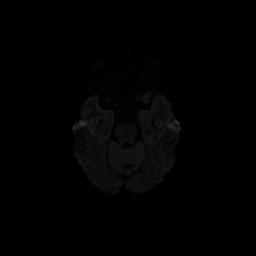
[im 57/86]
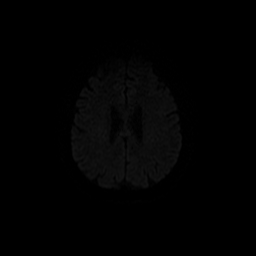
[im 86/86]
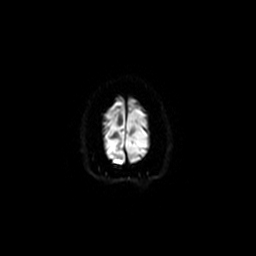

[Series 4: (id) mt fs · axial · 1.4mm · 0.43mm/px · z∈[-53,+20]mm · 6 of 136 slices shown]
[im 1/136]
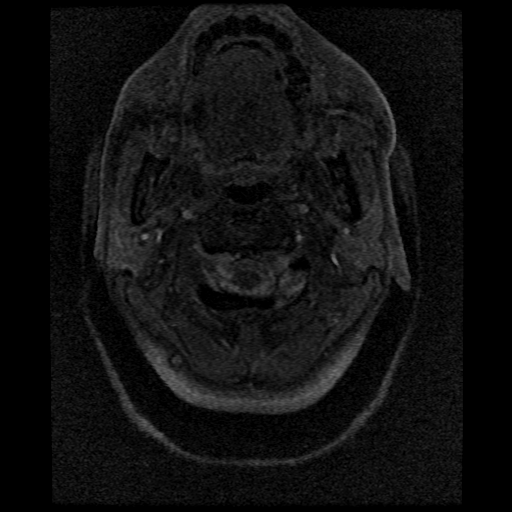
[im 23/136]
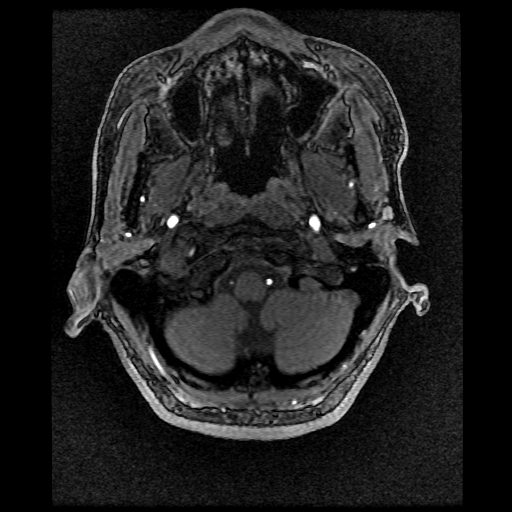
[im 46/136]
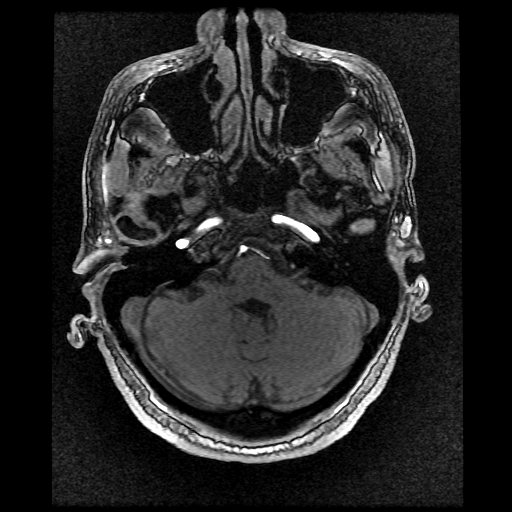
[im 68/136]
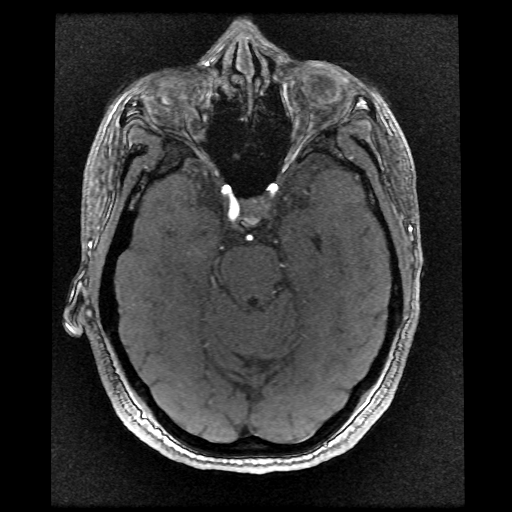
[im 91/136]
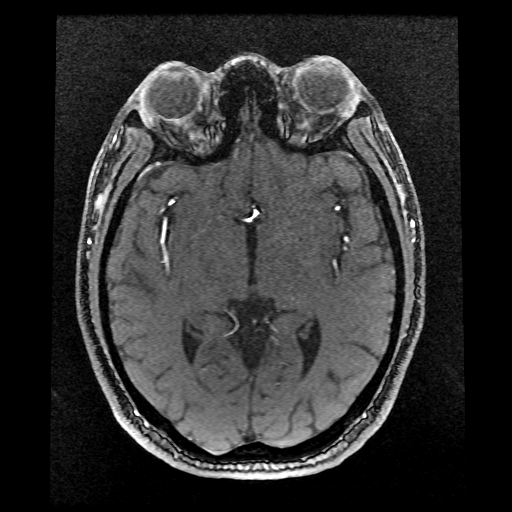
[im 113/136]
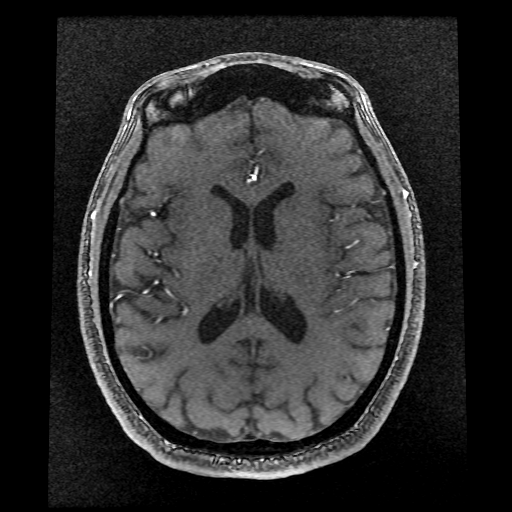

[Series 5: T1 · sagittal · 5.0mm · 0.47mm/px · 1 of 23 slices shown (1 of 3)]
[im 1/23]
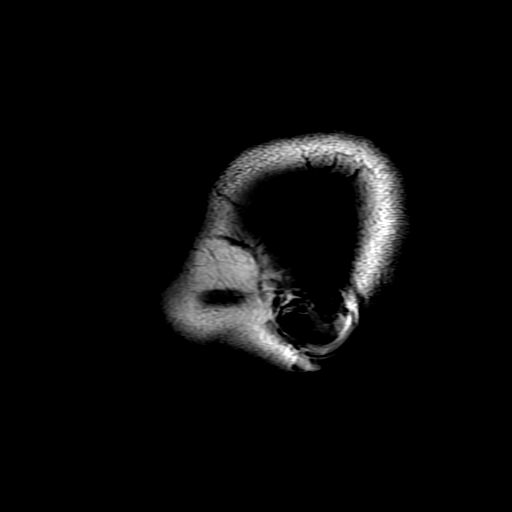

[Series 6: T2 · axial · 5.0mm · 0.43mm/px · 1 of 22 slices shown (1 of 4)]
[im 1/22]
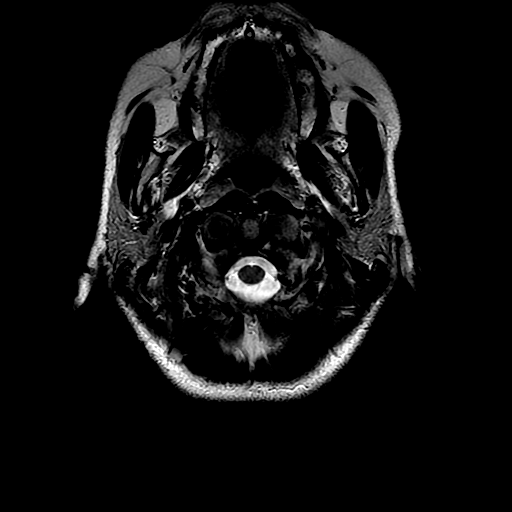

[Series 11: T2 · coronal · non-contrast · 5.0mm · 0.39mm/px · 2 of 25 slices shown (2 of 4)]
[im 1/25]
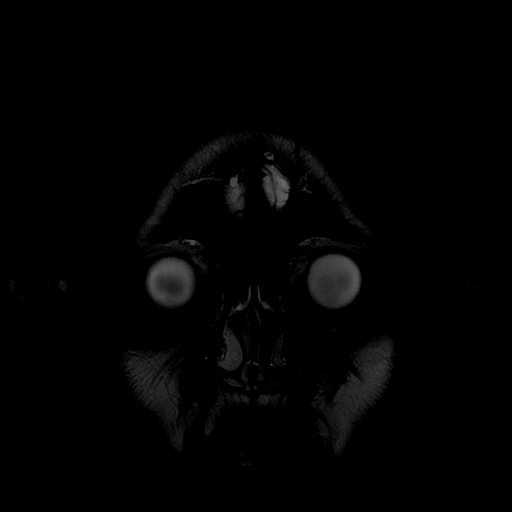
[im 25/25]
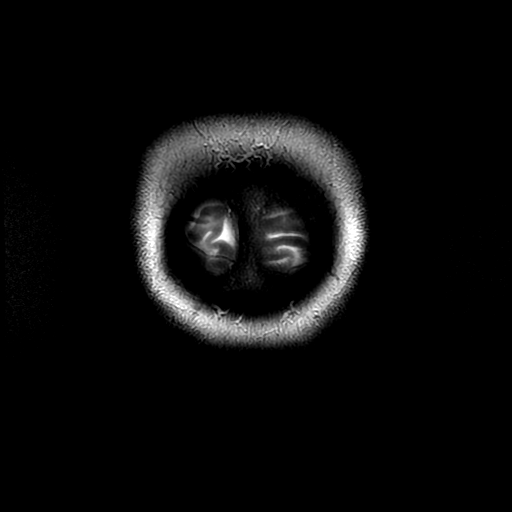

[Series 13: T2 · sagittal · 3.0mm · 0.86mm/px · 1 of 13 slices shown (3 of 4)]
[im 1/13]
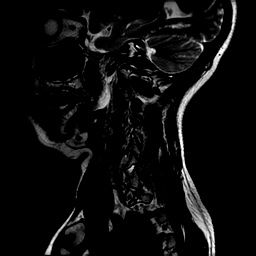

[Series 14: T1 · sagittal · 3.0mm · 0.43mm/px · 1 of 13 slices shown (2 of 3)]
[im 1/13]
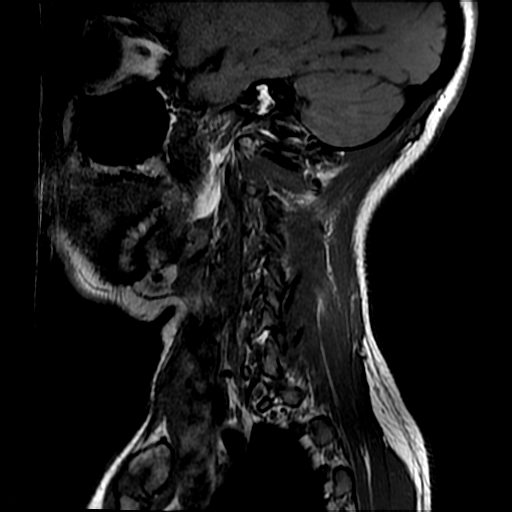

[Series 17: T2 · axial · 3.0mm · 0.39mm/px · z∈[-142,-57]mm · 2 of 28 slices shown (4 of 4)]
[im 1/28]
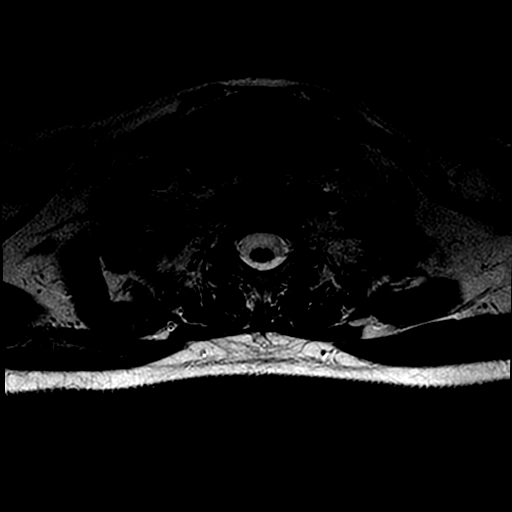
[im 28/28]
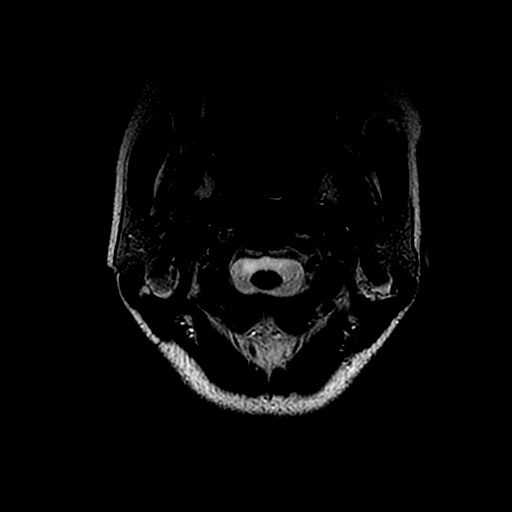

[Series 18: T1 · axial · non-contrast · 3.0mm · 0.78mm/px · z∈[-142,-57]mm · 2 of 28 slices shown (3 of 3)]
[im 1/28]
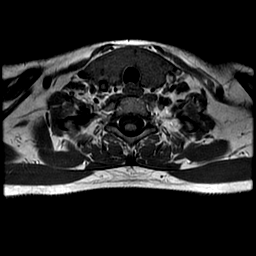
[im 28/28]
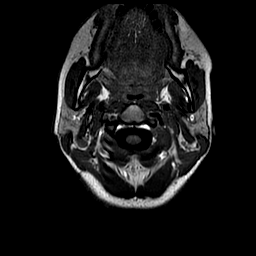

[Series 19: T1 fat-sat post-contrast · sagittal · 3.0mm · 0.43mm/px · 1 of 13 slices shown]
[im 1/13]
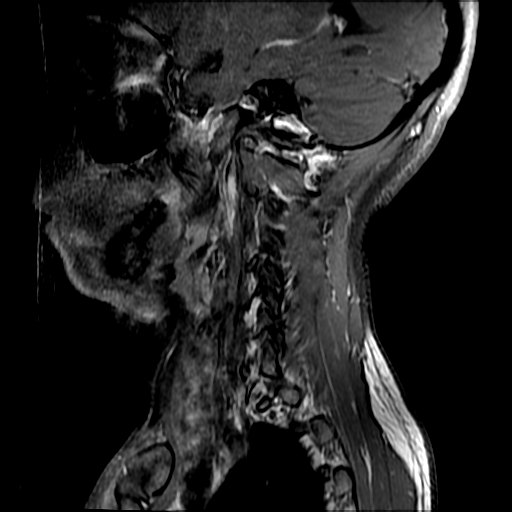

[Series 20: T1 post-contrast · axial · 3.0mm · 0.78mm/px · z∈[-142,-57]mm · 2 of 28 slices shown (1 of 2)]
[im 1/28]
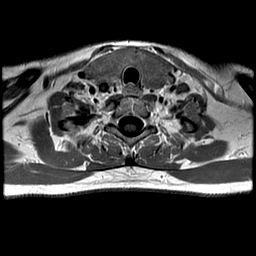
[im 28/28]
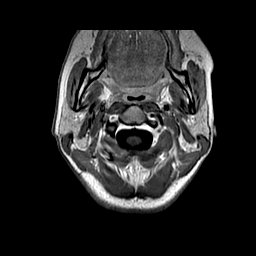

[Series 22: T1 post-contrast · coronal · 5.0mm · 0.39mm/px · 2 of 25 slices shown (2 of 2)]
[im 1/25]
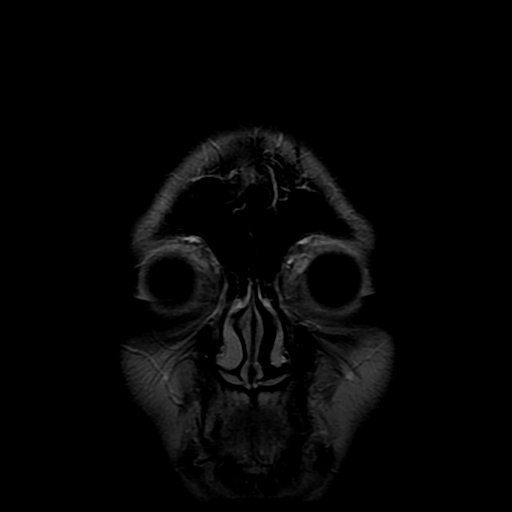
[im 25/25]
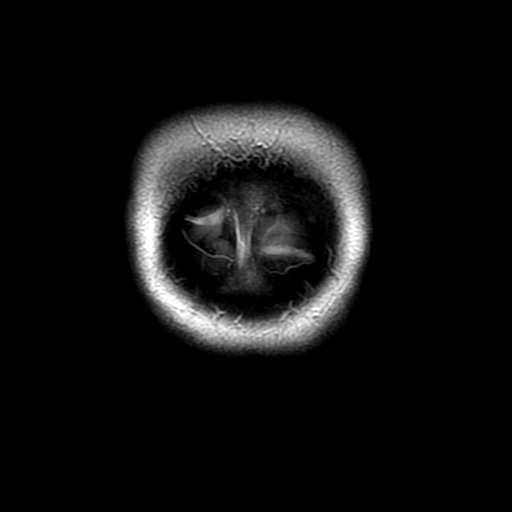

[25 of 48 positions shown; findings below may reference images not displayed]

FINDINGS: Generalized Thyromegaly, but no discrete thyroid nodule or mass
identified. The entire thyroid is not included on these images.

The right vertebral artery appears diminutive throughout the neck,
and also appears to have a late entry into the right cervical
transverse foramen.

Straightening of cervical lordosis. No marrow edema or evidence of
acute osseous abnormality.

Cervicomedullary junction is within normal limits. Cervical and
visualized upper thoracic spinal cord signal is normal. No abnormal
intradural enhancement.

Minimal disc bulging at C5-C6. Mild left facet hypertrophy at that
level. However, no associated stenosis and no cervical spine
degeneration elsewhere. Fairly capacious cervical and visualized
upper thoracic thecal sac.
IMPRESSION: 1. Negative MRI appearance of the cervical spine and spinal cord. No
significant cervical spine degeneration.
2. Generalized thyromegaly. The entire thyroid is not included, but
no discrete thyroid nodule or mass is identified. Recommend
correlation with endocrine labs and thyroid ultrasound follow-up.
3. The right vertebral artery appears diminutive throughout its
course, suggesting normal anatomic variation.
Results of this exam plus the MRI/MRA of the brain reported
separately (please see that report)) were discussed by telephone
with Neurology Provider HA SSAN WACWAC on 12/28/2015 at 2230 hours.
